# Patient Record
Sex: Female | Born: 1995
Health system: Southern US, Community
[De-identification: ages and names within clinical notes are randomized; demographics above are authoritative.]

---
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
## 2020-05-14 ENCOUNTER — Ambulatory Visit
Admission: EM | Admit: 2020-05-14 | Discharge: 2020-05-14 | Disposition: A | Discharge status: Home / Self Care | Payer: Managed Care, Other (non HMO) | Attending: Emergency Medicine | Admitting: Emergency Medicine

## 2020-05-14 ENCOUNTER — Ambulatory Visit: Payer: Self-pay

## 2020-05-14 ENCOUNTER — Other Ambulatory Visit: Payer: Self-pay

## 2020-05-14 DIAGNOSIS — Z1152 Encounter for screening for COVID-19: Secondary | ICD-10-CM | POA: Diagnosis not present

## 2020-05-14 DIAGNOSIS — J029 Acute pharyngitis, unspecified: Secondary | ICD-10-CM

## 2020-05-14 DIAGNOSIS — J069 Acute upper respiratory infection, unspecified: Secondary | ICD-10-CM

## 2020-05-14 MED ORDER — CETIRIZINE HCL 10 MG PO TABS: 10.0000 mg | ORAL_TABLET | Freq: Every day | ORAL | 0 refills | Status: AC

## 2020-05-14 MED ORDER — BENZONATATE 100 MG PO CAPS: 100.0000 mg | ORAL_CAPSULE | Freq: Three times a day (TID) | ORAL | 0 refills | Status: DC

## 2020-05-14 MED ORDER — DEXAMETHASONE 4 MG PO TABS: 4.0000 mg | ORAL_TABLET | Freq: Every day | ORAL | 0 refills | Status: AC

## 2020-05-14 MED ORDER — FLUTICASONE PROPIONATE 50 MCG/ACT NA SUSP: 1.0000 | Freq: Every day | NASAL | 0 refills | Status: AC

## 2020-05-14 NOTE — Discharge Instructions (Addendum)
COVID testing ordered.  It will take between 2-7 days for test results.  Someone will contact you regarding abnormal results.    In the meantime: You should remain isolated in your home for 10 days from symptom onset AND greater than 24 hours after symptoms resolution (absence of fever without the use of fever-reducing medication and improvement in respiratory symptoms), whichever is longer Get plenty of rest and push fluids Tessalon Perles prescribed for cough Zyrtec for nasal congestion, runny nose, and/or sore throat Flonase for nasal congestion and runny nose Discharge was prescribed Use OTC throat lozenges such as Halls, Cepacol or Vicks to soothe throat Use medications daily for symptom relief Use OTC medications like ibuprofen or tylenol as needed fever or pain Call or go to the ED if you have any new or worsening symptoms such as fever, worsening cough, shortness of breath, chest tightness, chest pain, turning blue, changes in mental status, etc..Marland Kitchen

## 2020-05-14 NOTE — ED Triage Notes (Signed)
Pt presents with headache and sore throat after covid exposure

## 2020-05-14 NOTE — ED Provider Notes (Addendum)
[facility]   [id number] 05/14/20 Arrival Time: 0849   CC: COVID symptoms  SUBJECTIVE: History from: patient and family.  Renee Brown is a 24 y.o. female who presented to the urgent care for complaint of headache, sore throat, cough and nasal congestion after Covid exposure few days ago..  Denies sick exposure to  flu or strep.  Denies recent travel.  Has tried OTC medication without relief.  Denies aggravating factors.  Denies previous symptoms in the past.   Denies fever, chills, fatigue, sinus pain, rhinorrhea,  SOB, wheezing, chest pain, nausea, changes in bowel or bladder habits.    ROS: As per HPI.  All other pertinent ROS negative.     History reviewed. No pertinent past medical history. History reviewed. No pertinent surgical history. No Known Allergies No current facility-administered medications on file prior to encounter.   No current outpatient medications on file prior to encounter.   Social History   Socioeconomic History  . Marital status: Single    Spouse name: Not on file  . Number of children: Not on file  . Years of education: Not on file  . Highest education level: Not on file  Occupational History  . Not on file  Tobacco Use  . Smoking status: Never Smoker  . Smokeless tobacco: Never Used  Vaping Use  . Vaping Use: Some days  Substance and Sexual Activity  . Alcohol use: Not Currently  . Drug use: Not Currently  . Sexual activity: Not on file  Other Topics Concern  . Not on file  Social History Narrative  . Not on file   Social Determinants of Health   Financial Resource Strain:   . Difficulty of Paying Living Expenses: Not on file  Food Insecurity:   . Worried About Programme researcher, broadcasting/film/video in the Last Year: Not on file  . Ran Out of Food in the Last Year: Not on file  Transportation Needs:   . Lack of Transportation (Medical): Not on file  . Lack of Transportation (Non-Medical): Not on file  Physical Activity:   . Days of  Exercise per Week: Not on file  . Minutes of Exercise per Session: Not on file  Stress:   . Feeling of Stress : Not on file  Social Connections:   . Frequency of Communication with Friends and Family: Not on file  . Frequency of Social Gatherings with Friends and Family: Not on file  . Attends Religious Services: Not on file  . Active Member of Clubs or Organizations: Not on file  . Attends Banker Meetings: Not on file  . Marital Status: Not on file  Intimate Partner Violence:   . Fear of Current or Ex-Partner: Not on file  . Emotionally Abused: Not on file  . Physically Abused: Not on file  . Sexually Abused: Not on file   History reviewed. No pertinent family history.  OBJECTIVE:  Vitals:   05/14/20 0911  BP: 113/76  Pulse: (!) 107  Resp: 20  Temp: 98.9 F (37.2 C)  SpO2: 98%     General appearance: alert; appears fatigued, but nontoxic; speaking in full sentences and tolerating own secretions HEENT: NCAT; Ears: EACs clear, TMs pearly gray; Eyes: PERRL.  EOM grossly intact. Sinuses: nontender; Nose: nares patent without rhinorrhea, Throat: oropharynx clear, tonsils non erythematous or enlarged, uvula midline  Neck: supple without LAD Lungs: unlabored respirations, symmetrical air entry; cough: moderate; no respiratory distress; CTAB Heart: regular rate and rhythm.  Radial  pulses 2+ symmetrical bilaterally Skin: warm and dry Psychological: alert and cooperative; normal mood and affect  LABS:  No results found for this or any previous visit (from the past 24 hour(s)).   ASSESSMENT & PLAN:  1. Encounter for screening for COVID-19   2. URI with cough and congestion   3. Sore throat     Meds ordered this encounter  Medications  . benzonatate (TESSALON) 100 MG capsule    Sig: Take 1 capsule (100 mg total) by mouth every 8 (eight) hours.    Dispense:  30 capsule    Refill:  0  . fluticasone (FLONASE) 50 MCG/ACT nasal spray    Sig: Place 1 spray into  both nostrils daily for 14 days.    Dispense:  16 g    Refill:  0  . cetirizine (ZYRTEC ALLERGY) 10 MG tablet    Sig: Take 1 tablet (10 mg total) by mouth daily.    Dispense:  30 tablet    Refill:  0  . dexamethasone (DECADRON) 4 MG tablet    Sig: Take 1 tablet (4 mg total) by mouth daily for 7 days.    Dispense:  7 tablet    Refill:  0    Discharge instructions  COVID testing ordered.  It will take between 2-7 days for test results.  Someone will contact you regarding abnormal results.    In the meantime: You should remain isolated in your home for 10 days from symptom onset AND greater than 24 hours after symptoms resolution (absence of fever without the use of fever-reducing medication and improvement in respiratory symptoms), whichever is longer Get plenty of rest and push fluids Tessalon Perles prescribed for cough Zyrtec for nasal congestion, runny nose, and/or sore throat Flonase for nasal congestion and runny nose Discharge was prescribed Use OTC throat lozenges such as Halls, Cepacol or Vicks to soothe throat Use medications daily for symptom relief Use OTC medications like ibuprofen or tylenol as needed fever or pain Call or go to the ED if you have any new or worsening symptoms such as fever, worsening cough, shortness of breath, chest tightness, chest pain, turning blue, changes in mental status, etc...   Reviewed expectations re: course of current medical issues. Questions answered. Outlined signs and symptoms indicating need for more acute intervention. Patient verbalized understanding. After Visit Summary given.         Durward Parcel, FNP 05/14/20 0941    Durward Parcel, FNP 05/14/20 [phone]

## 2020-05-16 LAB — COVID-19, FLU A+B AND RSV
Influenza A, NAA: NOT DETECTED
Influenza B, NAA: NOT DETECTED
RSV, NAA: NOT DETECTED
SARS-CoV-2, NAA: DETECTED — AB

## 2020-05-22 ENCOUNTER — Other Ambulatory Visit: Payer: Self-pay

## 2020-05-22 ENCOUNTER — Other Ambulatory Visit: Payer: Managed Care, Other (non HMO)

## 2020-05-22 DIAGNOSIS — Z20822 Contact with and (suspected) exposure to covid-19: Secondary | ICD-10-CM

## 2020-05-24 LAB — SARS-COV-2, NAA 2 DAY TAT

## 2020-05-24 LAB — SPECIMEN STATUS REPORT

## 2020-05-24 LAB — NOVEL CORONAVIRUS, NAA: SARS-CoV-2, NAA: DETECTED — AB

## 2020-05-27 ENCOUNTER — Other Ambulatory Visit: Payer: Self-pay

## 2020-05-27 DIAGNOSIS — Z20822 Contact with and (suspected) exposure to covid-19: Secondary | ICD-10-CM

## 2020-05-29 LAB — SARS-COV-2, NAA 2 DAY TAT

## 2020-05-29 LAB — SPECIMEN STATUS REPORT

## 2020-05-29 LAB — NOVEL CORONAVIRUS, NAA: SARS-CoV-2, NAA: DETECTED — AB

## 2020-06-01 ENCOUNTER — Telehealth (HOSPITAL_COMMUNITY): Payer: Self-pay | Admitting: Family

## 2020-06-01 NOTE — Telephone Encounter (Signed)
Called to discuss with Renee Brown about Covid symptoms and potential candidacy for the use of sotrovimab, a combination monoclonal antibody infusion for those with mild to moderate Covid symptoms and at a high risk of hospitalization.     Pt is qualified for this infusion at the infusion center due to co-morbid conditions and/or a member of an at-risk group, however unable to reach patient. VM left.   Carmela Piechowski,NP

## 2020-07-28 ENCOUNTER — Ambulatory Visit (INDEPENDENT_AMBULATORY_CARE_PROVIDER_SITE_OTHER): Payer: Managed Care, Other (non HMO)

## 2020-07-28 ENCOUNTER — Ambulatory Visit
Admission: EM | Admit: 2020-07-28 | Discharge: 2020-07-28 | Disposition: A | Discharge status: Home / Self Care | Payer: Managed Care, Other (non HMO) | Attending: Emergency Medicine | Admitting: Emergency Medicine

## 2020-07-28 ENCOUNTER — Other Ambulatory Visit: Payer: Self-pay

## 2020-07-28 DIAGNOSIS — R0602 Shortness of breath: Secondary | ICD-10-CM | POA: Diagnosis not present

## 2020-07-28 DIAGNOSIS — M940 Chondrocostal junction syndrome [Tietze]: Secondary | ICD-10-CM

## 2020-07-28 DIAGNOSIS — R079 Chest pain, unspecified: Secondary | ICD-10-CM | POA: Diagnosis not present

## 2020-07-28 LAB — POCT URINE PREGNANCY: Preg Test, Ur: NEGATIVE

## 2020-07-28 MED ORDER — ALBUTEROL SULFATE HFA 108 (90 BASE) MCG/ACT IN AERS: 1.0000 | INHALATION_SPRAY | Freq: Four times a day (QID) | RESPIRATORY_TRACT | 1 refills | Status: AC | PRN

## 2020-07-28 NOTE — ED Provider Notes (Signed)
[facility]   [id number] 07/28/20 Arrival Time: 0804   Chief Complaint  Patient presents with  . pain under right breast     SUBJECTIVE: History from: patient.  Renee Brown is a 25 y.o. female who presented to the urgent care for complaint of pain on the right breast and shortness of breath for the past 2 days.  Denies any precipitating event.  Localized pain to the right ribs under breast.  She describes the pain as constant and achy.  She has tried OTC medications without relief.  Her s symptoms are made worse with respiration.  She denies similar symptoms in the past.  Denies chills, fever, nausea, vomiting, diarrhea, chest pain.   ROS: As per HPI.  All other pertinent ROS negative.     No past medical history on file. No past surgical history on file. No Known Allergies No current facility-administered medications on file prior to encounter.   Current Outpatient Medications on File Prior to Encounter  Medication Sig Dispense Refill  . benzonatate (TESSALON) 100 MG capsule Take 1 capsule (100 mg total) by mouth every 8 (eight) hours. 30 capsule 0  . cetirizine (ZYRTEC ALLERGY) 10 MG tablet Take 1 tablet (10 mg total) by mouth daily. 30 tablet 0  . fluticasone (FLONASE) 50 MCG/ACT nasal spray Place 1 spray into both nostrils daily for 14 days. 16 g 0   Social History   Socioeconomic History  . Marital status: Married    Spouse name: Not on file  . Number of children: Not on file  . Years of education: Not on file  . Highest education level: Not on file  Occupational History  . Not on file  Tobacco Use  . Smoking status: Never Smoker  . Smokeless tobacco: Never Used  Vaping Use  . Vaping Use: Some days  Substance and Sexual Activity  . Alcohol use: Not Currently  . Drug use: Not Currently  . Sexual activity: Not on file  Other Topics Concern  . Not on file  Social History Narrative   ** Merged History Encounter **       ** Merged History  Encounter **       ** Merged History Encounter **       Social Determinants of Corporate investment banker Strain: Not on file  Food Insecurity: Not on file  Transportation Needs: Not on file  Physical Activity: Not on file  Stress: Not on file  Social Connections: Not on file  Intimate Partner Violence: Not on file   No family history on file.  OBJECTIVE:  Vitals:   07/28/20 0817  BP: (!) 145/85  Pulse: 66  Resp: 18  Temp: 98.5 F (36.9 C)  TempSrc: Oral  SpO2: 98%     Physical Exam Vitals and nursing note reviewed.  Constitutional:      General: She is not in acute distress.    Appearance: Normal appearance. She is normal weight. She is not ill-appearing, toxic-appearing or diaphoretic.  HENT:     Head: Normocephalic.  Cardiovascular:     Rate and Rhythm: Normal rate and regular rhythm.     Pulses: Normal pulses.     Heart sounds: Normal heart sounds. No murmur heard. No friction rub. No gallop.   Pulmonary:     Effort: Pulmonary effort is normal. No respiratory distress.     Breath sounds: Normal breath sounds. No stridor. No wheezing, rhonchi or rales.  Chest:     Chest  wall: No tenderness.  Musculoskeletal:        General: Tenderness present.     Comments: Tenderness on the right breast.  There is no ecchymosis, open wound, lesion or warmth present.  Neurological:     Mental Status: She is alert and oriented to person, place, and time.      LABS:  Results for orders placed or performed during the hospital encounter of 07/28/20 (from the past 24 hour(s))  POCT urine pregnancy     Status: None   Collection Time: 07/28/20  8:31 AM  Result Value Ref Range   Preg Test, Ur Negative Negative     RADIOLOGY      DG Chest 2 View  Result Date: 07/28/2020 CLINICAL DATA:  Pain under right breast. EXAM: CHEST - 2 VIEW COMPARISON:  None. FINDINGS: The heart size and mediastinal contours are within normal limits. Both lungs are clear. No visible pleural  effusions or pneumothorax. No acute osseous abnormality. IMPRESSION: No active cardiopulmonary disease. Electronically Signed   By: Feliberto Harts MD   On: 07/28/2020 08:45     Chest X-ray is negative for cardiopulmonary disease.  I have reviewed the x-ray myself and the radiologist interpretation.  I am in agreement with the radiologist interpretation.   ASSESSMENT & PLAN:  1. SOB (shortness of breath)   2. Costochondritis     Meds ordered this encounter  Medications  . albuterol (VENTOLIN HFA) 108 (90 Base) MCG/ACT inhaler    Sig: Inhale 1-2 puffs into the lungs every 6 (six) hours as needed for shortness of breath.    Dispense:  18 g    Refill:  1    Discharge instructions  Get plenty of rest and push fluids Prescribe albuterol Use medications daily for symptom relief Use OTC medications like ibuprofen or tylenol as needed fever or pain Call or go to the ED if you have any new or worsening symptoms such as fever, worsening cough, shortness of breath, chest tightness, chest pain, turning blue, changes in mental status, etc...   Reviewed expectations re: course of current medical issues. Questions answered. Outlined signs and symptoms indicating need for more acute intervention. Patient verbalized understanding. After Visit Summary given.         Durward Parcel, FNP 07/28/20 [phone]

## 2020-07-28 NOTE — Discharge Instructions (Addendum)
Get plenty of rest and push fluids Prescribe albuterol Use medications daily for symptom relief Use OTC medications like ibuprofen or tylenol as needed fever or pain Call or go to the ED if you have any new or worsening symptoms such as fever, worsening cough, shortness of breath, chest tightness, chest pain, turning blue, changes in mental status, etc..Marland Kitchen

## 2020-07-28 NOTE — ED Triage Notes (Signed)
SOB x 2 days.  States she has a pain under her right breast when she breaths hard.  States she has been vaping nicotine for 6 months.  History of covid back in December.

## 2022-01-01 ENCOUNTER — Ambulatory Visit: Payer: Managed Care, Other (non HMO)

## 2022-03-09 ENCOUNTER — Ambulatory Visit
Admission: EM | Admit: 2022-03-09 | Discharge: 2022-03-09 | Disposition: A | Discharge status: Home / Self Care | Payer: Managed Care, Other (non HMO)

## 2022-03-09 DIAGNOSIS — Z1152 Encounter for screening for COVID-19: Secondary | ICD-10-CM | POA: Diagnosis present

## 2022-03-09 DIAGNOSIS — J069 Acute upper respiratory infection, unspecified: Secondary | ICD-10-CM | POA: Insufficient documentation

## 2022-03-09 LAB — POCT RAPID STREP A (OFFICE): Rapid Strep A Screen: NEGATIVE

## 2022-03-09 MED ORDER — BENZONATATE 100 MG PO CAPS: 100.0000 mg | ORAL_CAPSULE | Freq: Three times a day (TID) | ORAL | 0 refills | Status: AC | PRN

## 2022-03-09 NOTE — Discharge Instructions (Addendum)
You have a viral upper respiratory infection.  This should improve over the next 7 to 10 days.  If your symptoms last more than 10 days without improvement, please follow-up with [place] or primary care provider.  With any chest pain or shortness of breath, go to the emergency room.    Rapid strep throat test is negative today.  We have tested for COVID-19 and we will call you with positive results.  You will see the results on MyChart as well.  Please stay home and isolate until you are aware of the results.  Some things that can make you feel better are: - Increased rest - Increasing fluid with water/sugar free electrolytes - Acetaminophen and ibuprofen as needed for fever/pain.  - Salt water gargling, chloraseptic spray and throat lozenges - OTC guaifenesin (Mucinex) 600 mg twice daily.  - Saline sinus flushes or a neti pot.  - Humidifying the air. -Cough Perles during the day for dry cough

## 2022-03-09 NOTE — ED Triage Notes (Signed)
Pt reports sore throat, neck pain and nasal congestion x 1 day. Neck pain improves with ibuprofen.

## 2022-03-09 NOTE — ED Provider Notes (Signed)
[facility]    CSN: [id number] Arrival date & time: 03/09/22  1551      History   Chief Complaint Chief Complaint  Patient presents with   Sore Throat   Nasal Congestion        Neck Pain    HPI Renee Brown is a 26 y.o. female.   Patient presents for 1 day of body aches, neck pain, cough worse first in the morning, nasal congestion, runny nose, sneezing, sore throat, and fatigue.  She denies fever, chills, shortness of breath, chest pain or tightness, chest congestion, postnasal drainage, sinus pressure, ear pain, abdominal pain, nausea/vomiting, diarrhea, decreased appetite, loss of taste or smell, and new rash.  No known sick contacts.  Has taken ibuprofen for symptoms which does help.     History reviewed. No pertinent past medical history.  There are no problems to display for this patient.   History reviewed. No pertinent surgical history.  OB History   No obstetric history on file.      Home Medications    Prior to Admission medications   Medication Sig Start Date End Date Taking? Authorizing Provider  ibuprofen (ADVIL) 200 MG tablet Take 200 mg by mouth every 6 (six) hours as needed.   Yes [provider]  albuterol (VENTOLIN HFA) 108 (90 Base) MCG/ACT inhaler Inhale 1-2 puffs into the lungs every 6 (six) hours as needed for shortness of breath. 07/28/20   Avegno, Zachery Dakins, FNP  benzonatate (TESSALON) 100 MG capsule Take 1 capsule (100 mg total) by mouth 3 (three) times daily as needed for cough. Do not take with alcohol or while driving or operating heavy machinery.  May cause drowsiness. 03/09/22   Valentino Nose, NP  cetirizine (ZYRTEC ALLERGY) 10 MG tablet Take 1 tablet (10 mg total) by mouth daily. 05/14/20   Avegno, Zachery Dakins, FNP  citalopram (CELEXA) 10 MG tablet Take 10 mg by mouth daily. 03/06/22   [provider]  fluticasone (FLONASE) 50 MCG/ACT nasal spray Place 1 spray into both nostrils daily for 14 days.  05/14/20 05/28/20  Durward Parcel, FNP    Family History History reviewed. No pertinent family history.  Social History Social History   Tobacco Use   Smoking status: Never   Smokeless tobacco: Never  Vaping Use   Vaping Use: Some days  Substance Use Topics   Alcohol use: Not Currently   Drug use: Not Currently     Allergies   Patient has no known allergies.   Review of Systems Review of Systems Per HPI  Physical Exam Triage Vital Signs ED Triage Vitals  Enc Vitals Group     BP 03/09/22 1601 130/82     Pulse Rate 03/09/22 1601 90     Resp 03/09/22 1601 16     Temp 03/09/22 1601 98.9 F (37.2 C)     Temp Source 03/09/22 1601 Oral     SpO2 03/09/22 1601 97 %     Weight --      Height --      Head Circumference --      Peak Flow --      Pain Score 03/09/22 1559 2     Pain Loc --      Pain Edu? --      Excl. in GC? --    No data found.  Updated Vital Signs BP 130/82 (BP Location: Right Arm)   Pulse 90   Temp 98.9 F (37.2 C) (Oral)  Resp 16   LMP  (Within Weeks)   SpO2 97%   Visual Acuity Right Eye Distance:   Left Eye Distance:   Bilateral Distance:    Right Eye Near:   Left Eye Near:    Bilateral Near:     Physical Exam Vitals and nursing note reviewed.  Constitutional:      General: She is not in acute distress.    Appearance: Normal appearance. She is not ill-appearing or toxic-appearing.  HENT:     Head: Normocephalic and atraumatic.     Right Ear: Tympanic membrane, ear canal and external ear normal. No drainage, swelling or tenderness. No middle ear effusion. Tympanic membrane is not erythematous.     Left Ear: Tympanic membrane, ear canal and external ear normal. No drainage, swelling or tenderness.  No middle ear effusion. Tympanic membrane is not erythematous.     Nose: Congestion and rhinorrhea present.     Mouth/Throat:     Mouth: Mucous membranes are moist.     Pharynx: Oropharynx is clear. Posterior oropharyngeal erythema  present. No oropharyngeal exudate.     Tonsils: No tonsillar exudate. 0 on the right. 0 on the left.  Eyes:     General: No scleral icterus.    Extraocular Movements: Extraocular movements intact.  Cardiovascular:     Rate and Rhythm: Normal rate and regular rhythm.  Pulmonary:     Effort: Pulmonary effort is normal. No respiratory distress.     Breath sounds: Normal breath sounds. No wheezing, rhonchi or rales.  Abdominal:     General: Abdomen is flat. Bowel sounds are normal.     Palpations: Abdomen is soft.     Tenderness: There is no abdominal tenderness.  Musculoskeletal:     Cervical back: Normal range of motion and neck supple.  Lymphadenopathy:     Cervical: Cervical adenopathy present.  Skin:    General: Skin is warm and dry.     Coloration: Skin is not jaundiced or pale.     Findings: No erythema or rash.  Neurological:     Mental Status: She is alert and oriented to person, place, and time.     Motor: No weakness.  Psychiatric:        Behavior: Behavior is cooperative.      UC Treatments / Results  Labs (all labs ordered are listed, but only abnormal results are displayed) Labs Reviewed  SARS CORONAVIRUS 2 (TAT 6-24 HRS)  POCT RAPID STREP A (OFFICE)    EKG   Radiology No results found.  Procedures Procedures (including critical care time)  Medications Ordered in UC Medications - No data to display  Initial Impression / Assessment and Plan / UC Course  I have reviewed the triage vital signs and the nursing notes.  Pertinent labs & imaging results that were available during my care of the patient were reviewed by me and considered in my medical decision making (see chart for details).    Patient is well-appearing, normotensive, afebrile, not tachycardic, not tachypneic, oxygenating well on room air.  Discussed with patient that symptoms are likely viral in etiology.  Rapid strep throat test negative.  COVID-19 testing obtained.  Supportive care  discussed.  Start [employer] as needed for dry cough.  Note given for work.  ER and return precautions discussed.  The patient was given the opportunity to ask questions.  All questions answered to their satisfaction.  The patient is in agreement to this plan.    Final Clinical  Impressions(s) / UC Diagnoses   Final diagnoses:  Encounter for screening for COVID-19  Viral URI with cough     Discharge Instructions      You have a viral upper respiratory infection.  This should improve over the next 7 to 10 days.  If your symptoms last more than 10 days without improvement, please follow-up with [place] or primary care provider.  With any chest pain or shortness of breath, go to the emergency room.    Rapid strep throat test is negative today.  We have tested for COVID-19 and we will call you with positive results.  You will see the results on MyChart as well.  Please stay home and isolate until you are aware of the results.  Some things that can make you feel better are: - Increased rest - Increasing fluid with water/sugar free electrolytes - Acetaminophen and ibuprofen as needed for fever/pain.  - Salt water gargling, chloraseptic spray and throat lozenges - OTC guaifenesin (Mucinex) 600 mg twice daily.  - Saline sinus flushes or a neti pot.  - Humidifying the air. -Cough Perles during the day for dry cough     ED Prescriptions     Medication Sig Dispense Auth. Provider   benzonatate (TESSALON) 100 MG capsule Take 1 capsule (100 mg total) by mouth 3 (three) times daily as needed for cough. Do not take with alcohol or while driving or operating heavy machinery.  May cause drowsiness. 30 capsule Valentino Nose, NP      PDMP not reviewed this encounter.   Valentino Nose, NP 03/09/22 1635

## 2022-03-10 LAB — SARS CORONAVIRUS 2 (TAT 6-24 HRS): SARS Coronavirus 2: NEGATIVE

## 2022-07-09 ENCOUNTER — Ambulatory Visit
Admission: EM | Admit: 2022-07-09 | Discharge: 2022-07-09 | Disposition: A | Discharge status: Home / Self Care | Payer: Managed Care, Other (non HMO) | Attending: Nurse Practitioner | Admitting: Nurse Practitioner

## 2022-07-09 DIAGNOSIS — S91312A Laceration without foreign body, left foot, initial encounter: Secondary | ICD-10-CM | POA: Diagnosis not present

## 2022-07-09 MED ORDER — AMOXICILLIN-POT CLAVULANATE 875-125 MG PO TABS: 1.0000 | ORAL_TABLET | Freq: Two times a day (BID) | ORAL | 0 refills | Status: AC

## 2022-07-09 NOTE — ED Notes (Addendum)
Triple antibiotic ointment, a non-adherent dressing, and coban wrap was applied to top left foot. Pt understands how to clean it and apply new clean dressing.

## 2022-07-09 NOTE — ED Provider Notes (Signed)
[facility]    CSN: [id number] Arrival date & time: 07/09/22  0902      History   Chief Complaint No chief complaint on file.   HPI Renee Brown is a 27 y.o. female.   The history is provided by the patient.   The patient presents with a laceration to the left foot.  States she was in the shower this morning when a glass fell onto the top of the left foot.  Patient admits to moderate bleeding.  Bleeding is controlled at this time.  Patient denies numbness or tingling to the left foot, but does have mild pain to the site.  She states that her tetanus shot is up-to-date.  History reviewed. No pertinent past medical history.  There are no problems to display for this patient.   History reviewed. No pertinent surgical history.  OB History   No obstetric history on file.      Home Medications    Prior to Admission medications   Medication Sig Start Date End Date Taking? Authorizing Provider  amoxicillin-clavulanate (AUGMENTIN) 875-125 MG tablet Take 1 tablet by mouth every 12 (twelve) hours. 07/09/22  Yes Caulder Wehner-Warren, Alda Lea, NP  albuterol (VENTOLIN HFA) 108 (90 Base) MCG/ACT inhaler Inhale 1-2 puffs into the lungs every 6 (six) hours as needed for shortness of breath. 07/28/20   Avegno, Darrelyn Hillock, FNP  benzonatate (TESSALON) 100 MG capsule Take 1 capsule (100 mg total) by mouth 3 (three) times daily as needed for cough. Do not take with alcohol or while driving or operating heavy machinery.  May cause drowsiness. 03/09/22   Eulogio Bear, NP  cetirizine (ZYRTEC ALLERGY) 10 MG tablet Take 1 tablet (10 mg total) by mouth daily. 05/14/20   Avegno, Darrelyn Hillock, FNP  citalopram (CELEXA) 10 MG tablet Take 10 mg by mouth daily. 03/06/22   [provider]  fluticasone (FLONASE) 50 MCG/ACT nasal spray Place 1 spray into both nostrils daily for 14 days. 05/14/20 05/28/20  Avegno, Darrelyn Hillock, FNP  ibuprofen (ADVIL) 200 MG tablet Take 200 mg by mouth every 6  (six) hours as needed.    [provider]    Family History History reviewed. No pertinent family history.  Social History Social History   Tobacco Use   Smoking status: Never   Smokeless tobacco: Never  Vaping Use   Vaping Use: Some days  Substance Use Topics   Alcohol use: Not Currently   Drug use: Not Currently     Allergies   Patient has no known allergies.   Review of Systems Review of Systems Per HPI  Physical Exam Triage Vital Signs ED Triage Vitals [07/09/22 0925]  Enc Vitals Group     BP 132/82     Pulse Rate 94     Resp      Temp 98.8 F (37.1 C)     Temp Source Oral     SpO2 99 %     Weight      Height      Head Circumference      Peak Flow      Pain Score 6     Pain Loc      Pain Edu?      Excl. in [place]?    No data found.  Updated Vital Signs BP 132/82 (BP Location: Right Arm)   Pulse 94   Temp 98.8 F (37.1 C) (Oral)   LMP 06/30/2022   SpO2 99%   Visual Acuity Right  Eye Distance:   Left Eye Distance:   Bilateral Distance:    Right Eye Near:   Left Eye Near:    Bilateral Near:     Physical Exam Vitals and nursing note reviewed.  Constitutional:      General: She is not in acute distress.    Appearance: Normal appearance.  HENT:     Head: Normocephalic.  Eyes:     Extraocular Movements: Extraocular movements intact.     Pupils: Pupils are equal, round, and reactive to light.  Cardiovascular:     Pulses:          Dorsalis pedis pulses are 2+ on the left side.       Posterior tibial pulses are 2+ on the left side.  Musculoskeletal:     Left foot: Normal range of motion.  Feet:     Left foot:     Skin integrity: No warmth.     Comments: +2 DP/PT pulses.  Neurovascular status is intact. Cap refill >2 Skin:    General: Skin is warm and dry.     Findings: Laceration present.     Comments: Laceration noted to the dorsal aspect of the left foot.  Laceration measures approximately 6 cm in length.  Bleeding is  controlled at this time.  Edges are clean.  No foreign object seen within the laceration.  Neurological:     General: No focal deficit present.     Mental Status: She is alert and oriented to person, place, and time.  Psychiatric:        Mood and Affect: Mood normal.        Behavior: Behavior normal.      UC Treatments / Results  Labs (all labs ordered are listed, but only abnormal results are displayed) Labs Reviewed - No data to display  EKG   Radiology No results found.  Procedures Laceration Repair  Date/Time: 07/09/2022 10:39 AM  Performed by: Tish Men, NP Authorized by: Tish Men, NP   Consent:    Consent obtained:  Verbal   Consent given by:  Patient and spouse   Risks discussed:  Infection, pain and poor wound healing   Alternatives discussed:  No treatment Universal protocol:    Procedure explained and questions answered to patient or proxy's satisfaction: yes     Patient identity confirmed:  Verbally with patient and arm band Anesthesia:    Anesthesia method:  Local infiltration   Local anesthetic:  Lidocaine 2% w/o epi (8 mL) Laceration details:    Location:  Foot   Foot location:  Top of L foot   Length (cm):  6 Pre-procedure details:    Preparation:  Patient was prepped and draped in usual sterile fashion Exploration:    Wound extent: no foreign bodies/material noted, no nerve damage noted, no tendon damage noted and no underlying fracture noted     Contaminated: no   Treatment:    Wound cleansed with: Hibiclens normal saline.   Amount of cleaning:  Standard   Irrigation method:  Tap and pressure wash Skin repair:    Repair method:  Sutures   Suture size:  3-0   Suture technique:  Simple interrupted   Number of sutures:  7 Approximation:    Approximation:  Loose Repair type:    Repair type:  Simple Post-procedure details:    Dressing:  Antibiotic ointment, non-adherent dressing and tube gauze (Coban)   Procedure  completion:  Tolerated well, no immediate complications  (including  critical care time)  Medications Ordered in UC Medications - No data to display  Initial Impression / Assessment and Plan / UC Course  I have reviewed the triage vital signs and the nursing notes.  Pertinent labs & imaging results that were available during my care of the patient were reviewed by me and considered in my medical decision making (see chart for details).  The patient is well-appearing, she is in no acute distress, vital signs are stable.  Patient with approximately 6 cm laceration to the top of the left foot.  7 sutures were placed with complete closure of the wound.  Will start patient on Augmentin for prophylaxis for infection due to the location of the wound and the current presentation.  Wound instructions were provided to the patient and her spouse along with indications of when follow-up would be necessary, signs and symptoms of infection, and use of over-the-counter analgesics for pain.  Patient to follow-up in this clinic on 07/19/2022 for suture removal.  Patient and spouse verbalized understanding.  All questions were answered.  Patient stable for discharge.  Work note was provided.  Final Clinical Impressions(s) / UC Diagnoses   Final diagnoses:  Laceration of left foot, initial encounter     Discharge Instructions      7 sutures were placed to the left foot.  Keep the sutures in place for the next 10 days.  You may return to this clinic for removal on 07/19/2022. Take medication as prescribed. Keep the dressing in place for 24 hours. Remove the dressing and clean the area with warm water in 24 hours.  When you are at home, may leave the area open to air.  When you are out, recommend keeping the area covered. May apply Neosporin to the lacerations as needed. May take over-the-counter Tylenol for pain or discomfort. Follow-up immediately if you develop swelling, increased redness that goes into  the hand or up the arm, drainage, or if you develop fever, chills, or other concerns. Follow-up as needed.      ED Prescriptions     Medication Sig Dispense Auth. Provider   amoxicillin-clavulanate (AUGMENTIN) 875-125 MG tablet Take 1 tablet by mouth every 12 (twelve) hours. 14 tablet Taleigha Pinson-Warren, Alda Lea, NP      PDMP not reviewed this encounter.   Tish Men, NP 07/09/22 1047

## 2022-07-09 NOTE — Discharge Instructions (Addendum)
7 sutures were placed to the left foot.  Keep the sutures in place for the next 10 days.  You may return to this clinic for removal on 07/19/2022. Take medication as prescribed. Keep the dressing in place for 24 hours. Remove the dressing and clean the area with warm water in 24 hours.  When you are at home, may leave the area open to air.  When you are out, recommend keeping the area covered. May apply Neosporin to the lacerations as needed. May take over-the-counter Tylenol for pain or discomfort. Follow-up immediately if you develop swelling, increased redness that goes into the hand or up the arm, drainage, or if you develop fever, chills, or other concerns. Follow-up as needed.

## 2022-07-09 NOTE — ED Triage Notes (Signed)
Pt reports glass fell on her left foot in the shower this morning.

## 2022-07-19 ENCOUNTER — Ambulatory Visit
Admission: RE | Admit: 2022-07-19 | Discharge: 2022-07-19 | Disposition: A | Discharge status: Home / Self Care | Payer: Managed Care, Other (non HMO) | Source: Ambulatory Visit | Attending: Nurse Practitioner | Admitting: Nurse Practitioner

## 2022-07-19 ENCOUNTER — Ambulatory Visit: Payer: Managed Care, Other (non HMO)

## 2022-07-19 VITALS — BP 136/78 | HR 71 | Temp 98.2°F | Resp 16

## 2022-07-19 DIAGNOSIS — M79672 Pain in left foot: Secondary | ICD-10-CM

## 2022-07-19 DIAGNOSIS — Z1881 Retained glass fragments: Secondary | ICD-10-CM

## 2022-07-19 DIAGNOSIS — L03116 Cellulitis of left lower limb: Secondary | ICD-10-CM

## 2022-07-19 MED ORDER — CEPHALEXIN 500 MG PO CAPS: 500.0000 mg | ORAL_CAPSULE | Freq: Four times a day (QID) | ORAL | 0 refills | Status: AC

## 2022-07-19 NOTE — Discharge Instructions (Addendum)
X-ray does not show any fracture or dislocation.  It does show a retained object.  This most likely was retained despite irrigation when your sutures were placed. This does not appear to be the cause of the swelling in your foot however. Take medication as prescribed. May take over-the-counter Tylenol Ibuprofen as needed to help with pain or discomfort. No weight bearing of the left foot until seen on 07/23/22. Keep the left foot elevated. Apply ice to the left foot to help with pain and swelling.  Apply for 20 minutes, remove for 1 hour, then repeat is much as possible. May take over-the-counter Tylenol or ibuprofen as needed for pain, fever, or general discomfort. Use the Ace wrap to help with swelling and compression.  Continue use of the crutches you are using with ambulation.  Recommend staying off the foot as much as possible for the next 48 hours. I would like for you to follow-up on 07/23/22. Will plan to remove the sutures at that time.  Follow-up sooner if you develop redness that goes up the leg or down the foot, worsening swelling, or if the area develops foul-smelling drainage or other concerns. Follow-up as needed.

## 2022-07-19 NOTE — ED Provider Notes (Signed)
[facility]    CSN: [id number] Arrival date & time: 07/19/22  [id number]      History   Chief Complaint Chief Complaint  Patient presents with   Foot Injury    Removing stitches - Entered by patient   Suture / Staple Removal    HPI Renee Brown is a 27 y.o. female.   The history is provided by the patient.   The patient presents for suture removal.  Sutures were placed on 07/09/22.  Patient states since that time, she is continue to have swelling and pain in the left foot.  She states that she was not able to finish the antibiotic because she developed GI symptoms such as nausea, vomiting, and diarrhea.  She states that she has noticed that the foot has become more swollen.  She states that she has been applying ice, and using crutches at this time.  Patient denies fever, chills, chest pain, abdominal pain, foul-smelling drainage, or redness.                                                                       History reviewed. No pertinent past medical history.  There are no problems to display for this patient.   History reviewed. No pertinent surgical history.  OB History   No obstetric history on file.      Home Medications    Prior to Admission medications   Medication Sig Start Date End Date Taking? Authorizing Provider  amoxicillin-clavulanate (AUGMENTIN) 875-125 MG tablet Take 1 tablet by mouth every 12 (twelve) hours. 07/09/22  Yes Angie Piercey-Warren, Alda Lea, NP  cephALEXin (KEFLEX) 500 MG capsule Take 1 capsule (500 mg total) by mouth 4 (four) times daily for 7 days. 07/19/22 07/26/22 Yes Shelena Castelluccio-Warren, Alda Lea, NP  cetirizine (ZYRTEC ALLERGY) 10 MG tablet Take 1 tablet (10 mg total) by mouth daily. 05/14/20  Yes Avegno, Darrelyn Hillock, FNP  citalopram (CELEXA) 10 MG tablet Take 10 mg by mouth daily. 03/06/22  Yes [provider]  ibuprofen (ADVIL) 200 MG tablet Take 200 mg by mouth every 6 (six) hours as needed.   Yes [provider]   albuterol (VENTOLIN HFA) 108 (90 Base) MCG/ACT inhaler Inhale 1-2 puffs into the lungs every 6 (six) hours as needed for shortness of breath. 07/28/20   Avegno, Darrelyn Hillock, FNP  benzonatate (TESSALON) 100 MG capsule Take 1 capsule (100 mg total) by mouth 3 (three) times daily as needed for cough. Do not take with alcohol or while driving or operating heavy machinery.  May cause drowsiness. 03/09/22   Eulogio Bear, NP  fluticasone (FLONASE) 50 MCG/ACT nasal spray Place 1 spray into both nostrils daily for 14 days. 05/14/20 05/28/20  Emerson Monte, FNP    Family History History reviewed. No pertinent family history.  Social History Social History   Tobacco Use   Smoking status: Never   Smokeless tobacco: Never  Vaping Use   Vaping Use: Some days  Substance Use Topics   Alcohol use: Not Currently   Drug use: Not Currently     Allergies   Patient has no known allergies.   Review of Systems Review of Systems Per HPI  Physical Exam Triage Vital Signs ED Triage Vitals  Enc Vitals Group     BP 07/19/22 1010 136/78     Pulse Rate 07/19/22 1010 71     Resp 07/19/22 1010 16     Temp 07/19/22 1010 98.2 F (36.8 C)     Temp Source 07/19/22 1010 Oral     SpO2 07/19/22 1010 98 %     Weight --      Height --      Head Circumference --      Peak Flow --      Pain Score 07/19/22 1011 2     Pain Loc --      Pain Edu? --      Excl. in [place]? --    No data found.  Updated Vital Signs BP 136/78 (BP Location: Right Arm)   Pulse 71   Temp 98.2 F (36.8 C) (Oral)   Resp 16   LMP 06/30/2022   SpO2 98%   Visual Acuity Right Eye Distance:   Left Eye Distance:   Bilateral Distance:    Right Eye Near:   Left Eye Near:    Bilateral Near:     Physical Exam Vitals and nursing note reviewed.  Constitutional:      General: She is not in acute distress.    Appearance: Normal appearance.  Musculoskeletal:     Left foot: Decreased range of motion. Normal capillary  refill. Swelling, laceration and tenderness present. Normal pulse.     Comments: Bruising noted to the dorsal aspect of the left foot.  Bruising noted in multiple stages.  Skin:    General: Skin is warm and dry.  Neurological:     General: No focal deficit present.     Mental Status: She is alert and oriented to person, place, and time.  Psychiatric:        Mood and Affect: Mood normal.        Behavior: Behavior normal.      UC Treatments / Results  Labs (all labs ordered are listed, but only abnormal results are displayed) Labs Reviewed - No data to display  EKG   Radiology DG Foot Complete Left  Result Date: 07/19/2022 CLINICAL DATA:  Left foot swelling and pain. Patient cut however foot with last 10 days ago. EXAM: LEFT FOOT - COMPLETE 3+ VIEW COMPARISON:  None Available. FINDINGS: There is no evidence of fracture or dislocation. There is no evidence of arthropathy or other focal bone abnormality. There is dorsal soft tissue swelling. There is a tiny linear radiopaque density in the soft tissues of the dorsal midfoot which likely represents retained foreign body given clinical history. IMPRESSION: 1. No acute fracture or dislocation. 2. Dorsal soft tissue swelling. 3. Tiny linear radiopaque density in the soft tissues of the dorsal midfoot likely represents retained foreign body given clinical history. Electronically Signed   By: Audie Pinto M.D.   On: 07/19/2022 10:40    Procedures Procedures (including critical care time)  Medications Ordered in UC Medications - No data to display  Initial Impression / Assessment and Plan / UC Course  I have reviewed the triage vital signs and the nursing notes.  Pertinent labs & imaging results that were available during my care of the patient were reviewed by me and considered in my medical decision making (see chart for details).  The patient is well-appearing, she is in no acute distress, vital signs are stable.  Patient  presents for suture removal.  Patient with continued swelling to the left foot.  Suture line is intact, sutures placed on 2/2 were also intact.  X-ray of the left foot did show a radiopaque object, most likely glass.  Symptoms are consistent with cellulitis.  Will start patient on Keflex 500 mg 4 times daily for the next 7 days, Ace wrap was also provided to provide compression and support, and use of over-the-counter analgesics were recommended for pain or discomfort.  Patient advised to stay off of the left foot is much as possible, and apply ice as advised.  Patient advised to follow-up in this clinic on 07/23/22 for reevaluation and for planned suture removal.  Strict follow-up precautions were provided to the patient.  Patient was in agreement with this plan of care.  Patient verbalizes understanding.  All questions were answered.  Patient stable for discharge.  Final Clinical Impressions(s) / UC Diagnoses   Final diagnoses:  Cellulitis of left foot  Retained glass fragment     Discharge Instructions      X-ray does not show any fracture or dislocation.  It does show a retained object.  This most likely was retained despite irrigation when your sutures were placed. This does not appear to be the cause of the swelling in your foot however. Take medication as prescribed. May take over-the-counter Tylenol Ibuprofen as needed to help with pain or discomfort. No weight bearing of the left foot until seen on 07/23/22. Keep the left foot elevated. Apply ice to the left foot to help with pain and swelling.  Apply for 20 minutes, remove for 1 hour, then repeat is much as possible. May take over-the-counter Tylenol or ibuprofen as needed for pain, fever, or general discomfort. Use the Ace wrap to help with swelling and compression.  Continue use of the crutches you are using with ambulation.  Recommend staying off the foot as much as possible for the next 48 hours. I would like for you to follow-up on  07/23/22. Will plan to remove the sutures at that time.  Follow-up sooner if you develop redness that goes up the leg or down the foot, worsening swelling, or if the area develops foul-smelling drainage or other concerns. Follow-up as needed.     ED Prescriptions     Medication Sig Dispense Auth. Provider   cephALEXin (KEFLEX) 500 MG capsule Take 1 capsule (500 mg total) by mouth 4 (four) times daily for 7 days. 28 capsule Jacarius Handel-Warren, Alda Lea, NP      PDMP not reviewed this encounter.   Tish Men, NP 07/19/22 1154

## 2022-07-19 NOTE — ED Triage Notes (Signed)
Patient came today to have stiches removed that were placed here on 07/09/22.

## 2022-07-23 ENCOUNTER — Ambulatory Visit
Admission: RE | Admit: 2022-07-23 | Discharge: 2022-07-23 | Disposition: A | Discharge status: Home / Self Care | Payer: Managed Care, Other (non HMO) | Source: Ambulatory Visit | Attending: Nurse Practitioner | Admitting: Nurse Practitioner

## 2022-07-23 VITALS — BP 121/72 | HR 70 | Temp 98.4°F | Resp 14

## 2022-07-23 DIAGNOSIS — S91302A Unspecified open wound, left foot, initial encounter: Secondary | ICD-10-CM

## 2022-07-23 DIAGNOSIS — S91312A Laceration without foreign body, left foot, initial encounter: Secondary | ICD-10-CM | POA: Diagnosis not present

## 2022-07-23 MED ORDER — MUPIROCIN 2 % EX OINT: 1.0000 | TOPICAL_OINTMENT | Freq: Two times a day (BID) | CUTANEOUS | 0 refills | Status: AC

## 2022-07-23 NOTE — ED Triage Notes (Signed)
Pt presents for follow up for swelling and suture recheck in left foot. Denies fever, chills, discharge.

## 2022-07-23 NOTE — ED Notes (Signed)
Applied ointment to site, applied gauze on top of dermaclips, and secured with coban. Pt tolerated well.   Site management and infection prevention education reviewed. Pt and pt family verbalized understanding.

## 2022-07-23 NOTE — ED Provider Notes (Addendum)
[facility]    CSN: [id number] Arrival date & time: 07/23/22  0917      History   Chief Complaint Chief Complaint  Patient presents with   Foot Injury    Check up on foot - Entered by patient   Appointment    0930    HPI Renee Brown is a 27 y.o. female.   The history is provided by the patient.   The patient presents for follow-up for a left foot injury that occurred on 07/09/2022.  Patient dropped a glass on the left foot while she was in the shower.  Patient presented to this clinic and 7 sutures were placed.  Patient followed up on 07/19/2022 with swelling to the left foot.  X-rays were completed which showed a small piece of glass that was retained in the foot, patient also complained of pain.  Today, patient with decreased swelling of the left foot, is still present.  She also has bruising noted to the top of the left foot.  She is taking Keflex 500 mg 4 times daily at this time.  She states she has been staying off of the foot, and keeping it iced and elevated.  She denies numbness, tingling, or radiation of pain.  Patient does complain of pain to the sides of her foot and on the top of her foot.  She denies fever, chills, chest pain, abdominal pain, nausea, vomiting, or diarrhea.  History reviewed. No pertinent past medical history.  There are no problems to display for this patient.   History reviewed. No pertinent surgical history.  OB History   No obstetric history on file.      Home Medications    Prior to Admission medications   Medication Sig Start Date End Date Taking? Authorizing Provider  mupirocin ointment (BACTROBAN) 2 % Apply 1 Application topically 2 (two) times daily. 07/23/22  Yes Bridgitte Felicetti-Warren, Alda Lea, NP  albuterol (VENTOLIN HFA) 108 (90 Base) MCG/ACT inhaler Inhale 1-2 puffs into the lungs every 6 (six) hours as needed for shortness of breath. 07/28/20   Avegno, Darrelyn Hillock, FNP  amoxicillin-clavulanate (AUGMENTIN) 875-125 MG tablet  Take 1 tablet by mouth every 12 (twelve) hours. 07/09/22   Viggo Perko-Warren, Alda Lea, NP  benzonatate (TESSALON) 100 MG capsule Take 1 capsule (100 mg total) by mouth 3 (three) times daily as needed for cough. Do not take with alcohol or while driving or operating heavy machinery.  May cause drowsiness. 03/09/22   Eulogio Bear, NP  cephALEXin (KEFLEX) 500 MG capsule Take 1 capsule (500 mg total) by mouth 4 (four) times daily for 7 days. 07/19/22 07/26/22  Hau Sanor-Warren, Alda Lea, NP  cetirizine (ZYRTEC ALLERGY) 10 MG tablet Take 1 tablet (10 mg total) by mouth daily. 05/14/20   Avegno, Darrelyn Hillock, FNP  citalopram (CELEXA) 10 MG tablet Take 10 mg by mouth daily. 03/06/22   [provider]  fluticasone (FLONASE) 50 MCG/ACT nasal spray Place 1 spray into both nostrils daily for 14 days. 05/14/20 05/28/20  Avegno, Darrelyn Hillock, FNP  ibuprofen (ADVIL) 200 MG tablet Take 200 mg by mouth every 6 (six) hours as needed.    [provider]    Family History History reviewed. No pertinent family history.  Social History Social History   Tobacco Use   Smoking status: Never   Smokeless tobacco: Never  Vaping Use   Vaping Use: Some days  Substance Use Topics   Alcohol use: Not Currently   Drug use: Not Currently  Allergies   Patient has no known allergies.   Review of Systems Review of Systems Per HPI  Physical Exam Triage Vital Signs ED Triage Vitals  Enc Vitals Group     BP 07/23/22 0955 121/72     Pulse Rate 07/23/22 0955 70     Resp 07/23/22 0955 14     Temp 07/23/22 0955 98.4 F (36.9 C)     Temp Source 07/23/22 0955 Oral     SpO2 07/23/22 0955 97 %     Weight --      Height --      Head Circumference --      Peak Flow --      Pain Score 07/23/22 0954 2     Pain Loc --      Pain Edu? --      Excl. in [place]? --    No data found.  Updated Vital Signs BP 121/72 (BP Location: Right Arm)   Pulse 70   Temp 98.4 F (36.9 C) (Oral)   Resp 14   LMP  (Within  Weeks) Comment: 3 weeks  SpO2 97%   Visual Acuity Right Eye Distance:   Left Eye Distance:   Bilateral Distance:    Right Eye Near:   Left Eye Near:    Bilateral Near:     Physical Exam Vitals and nursing note reviewed.  Constitutional:      General: She is not in acute distress.    Appearance: Normal appearance.  Skin:    General: Skin is warm and dry.     Findings: Laceration present.     Comments: Laceration to the left foot.  7 sutures are in place, suture line is intact.  No oozing, fluctuance, or drainage present.  Neurological:     General: No focal deficit present.     Mental Status: She is alert and oriented to person, place, and time.  Psychiatric:        Mood and Affect: Mood normal.        Behavior: Behavior normal.      7 sutures were removed from the laceration to the left foot.  Prior to suture removal, suture line was intact.  Post suture removal, wound dehisced.  3 derma clips were applied with use of benzoin.  Will refer patient to wound care for further evaluation of the wound and treatment.  UC Treatments / Results  Labs (all labs ordered are listed, but only abnormal results are displayed) Labs Reviewed - No data to display  EKG   Radiology No results found.  Procedures Procedures (including critical care time)  Medications Ordered in UC Medications - No data to display  Initial Impression / Assessment and Plan / UC Course  I have reviewed the triage vital signs and the nursing notes.  Pertinent labs & imaging results that were available during my care of the patient were reviewed by me and considered in my medical decision making (see chart for details).  Patient is well-appearing, she is in no acute distress, vital signs are stable.  Wound to the left foot with dehiscence after suture removal.  There is no oozing, fluctuance, or drainage at the wound to the left foot.  There is concern due to the wound opening after suture removal.   Discussing with Dr. Jamse Arn, discussion with Dr. Lanny Cramp, we are in agreement that patient should refer to wound care for further evaluation and for further treatment to ensure appropriate healing of the wound to  her left foot.  Patient will continue Keflex 500 mg at this time until prescription is completed.  Patient was prescribed mupirocin ointment 2% to apply to the left foot twice daily.  Supportive care recommendations were provided to the patient to include continuing elevation of the left foot, and weightbearing on the left foot at this time, and continuing to use ice.  Patient was given strict ER follow-up precautions, she is scheduled to follow-up in this clinic in 48 hours for a wound recheck.  Patient and husband are in agreement with this plan of care.  All questions were answered.  Understanding is verbalized.  Patient stable for discharge.  Received call from Marletta Lor with the wound care center.  She will plan to call the patient and schedule appointment for a wound consult on 07/26/2022 at the [place] [facility] location. Final Clinical Impressions(s) / UC Diagnoses   Final diagnoses:  Laceration of left foot, initial encounter  Unspecified open wound, left foot, initial encounter     Discharge Instructions      Apply medication as prescribed. Continue to stay off of the left foot at this time. Continue icing and elevating the foot.  Apply ice for 20 minutes, remove for 1 hour, then repeat as much as possible.  Keep the left foot elevated above the level of the heart as much as possible to help decrease swelling. Continue use of crutches at this time. You have been referred to [facility] in [place] at [place]. You will be contacted today regarding the appointment date and time. If you develop worsening redness, swelling, foul-smelling drainage, or other concerns, please follow-up in the emergency department. As discussed, please follow-up in this clinic on  07/25/2022 for wound check. Follow-up as needed.      ED Prescriptions     Medication Sig Dispense Auth. Provider   mupirocin ointment (BACTROBAN) 2 % Apply 1 Application topically 2 (two) times daily. 45 g Grethel Zenk-Warren, Alda Lea, NP      PDMP not reviewed this encounter.   Tish Men, NP 07/23/22 1155    Eryn Marandola-Warren, Alda Lea, NP 07/23/22 1505

## 2022-07-23 NOTE — Discharge Instructions (Addendum)
Apply medication as prescribed. Continue to stay off of the left foot at this time. Continue icing and elevating the foot.  Apply ice for 20 minutes, remove for 1 hour, then repeat as much as possible.  Keep the left foot elevated above the level of the heart as much as possible to help decrease swelling. Continue use of crutches at this time. You have been referred to [facility] in [place] at [place]. You will be contacted today regarding the appointment date and time. If you develop worsening redness, swelling, foul-smelling drainage, or other concerns, please follow-up in the emergency department. As discussed, please follow-up in this clinic on 07/25/2022 for wound check. Follow-up as needed.

## 2022-07-25 ENCOUNTER — Ambulatory Visit
Admission: RE | Admit: 2022-07-25 | Discharge: 2022-07-25 | Disposition: A | Discharge status: Home / Self Care | Payer: Managed Care, Other (non HMO) | Source: Ambulatory Visit | Attending: Nurse Practitioner | Admitting: Nurse Practitioner

## 2022-07-25 VITALS — BP 116/70 | HR 79 | Temp 98.1°F | Resp 18

## 2022-07-25 DIAGNOSIS — S91302A Unspecified open wound, left foot, initial encounter: Secondary | ICD-10-CM | POA: Diagnosis not present

## 2022-07-25 DIAGNOSIS — S91312A Laceration without foreign body, left foot, initial encounter: Secondary | ICD-10-CM

## 2022-07-25 MED ORDER — TRAMADOL HCL 50 MG PO TABS: 50.0000 mg | ORAL_TABLET | Freq: Four times a day (QID) | ORAL | 0 refills | Status: AC | PRN

## 2022-07-25 NOTE — ED Provider Notes (Addendum)
[facility]    CSN: [id number] Arrival date & time: 07/25/22  1046      History   Chief Complaint Chief Complaint  Patient presents with   Foot Injury    Follow up appointment - Entered by patient    HPI Renee Brown is a 27 y.o. female.   The history is provided by the patient and the spouse.   The patient presents for follow-up after a laceration to the left foot on 07/09/2022.  Patient presented that day and had 7 sutures placed to the dorsal aspect of the left foot just below the left ankle.  Patient was prescribed Augmentin, but was unable to tolerate it.  Patient returned on 07/19/2022 for suture removal.  On the day, patient had moderate swelling, pain, bruising, and tenderness to the left foot.  An x-ray was performed that same day which revealed a small shard of glass to the left foot.  Patient was provided prescription for Keflex 500 mg 4 times daily at that time and advised to stay off of the left foot, with and instructions to ice and elevate the foot is much as possible.  Patient return to this clinic on 07/23/2022 with decreased swelling.  7 sutures were removed at that time, after the last suture was removed, the wound dehisced.  Derma clips were applied at that time along with a dressing to the left foot.  Patient was further advised to stay off of the foot, nonweightbearing, and to continue RICE therapy.  Today, patient presents for wound check.  Derma clips are in place at this time.  She does have bloody drainage noted to the derma clip.  She also states that she has increased pain in the left foot today.  Patient denies new fever, chills, chest pain, abdominal pain, nausea, vomiting, diarrhea, warmth to the foot, foul-smelling drainage from the site, or decreased sensation to the foot.  History reviewed. No pertinent past medical history.  There are no problems to display for this patient.   History reviewed. No pertinent surgical history.  OB History    No obstetric history on file.      Home Medications    Prior to Admission medications   Medication Sig Start Date End Date Taking? Authorizing Provider  traMADol (ULTRAM) 50 MG tablet Take 1 tablet (50 mg total) by mouth every 6 (six) hours as needed. 07/25/22  Yes Sal Spratley-Warren, Alda Lea, NP  albuterol (VENTOLIN HFA) 108 (90 Base) MCG/ACT inhaler Inhale 1-2 puffs into the lungs every 6 (six) hours as needed for shortness of breath. 07/28/20   Avegno, Darrelyn Hillock, FNP  amoxicillin-clavulanate (AUGMENTIN) 875-125 MG tablet Take 1 tablet by mouth every 12 (twelve) hours. 07/09/22   Jasier Calabretta-Warren, Alda Lea, NP  benzonatate (TESSALON) 100 MG capsule Take 1 capsule (100 mg total) by mouth 3 (three) times daily as needed for cough. Do not take with alcohol or while driving or operating heavy machinery.  May cause drowsiness. 03/09/22   Eulogio Bear, NP  cephALEXin (KEFLEX) 500 MG capsule Take 1 capsule (500 mg total) by mouth 4 (four) times daily for 7 days. 07/19/22 07/26/22  Stesha Neyens-Warren, Alda Lea, NP  cetirizine (ZYRTEC ALLERGY) 10 MG tablet Take 1 tablet (10 mg total) by mouth daily. 05/14/20   Avegno, Darrelyn Hillock, FNP  citalopram (CELEXA) 10 MG tablet Take 10 mg by mouth daily. 03/06/22   [provider]  fluticasone (FLONASE) 50 MCG/ACT nasal spray Place 1 spray into both nostrils daily  for 14 days. 05/14/20 05/28/20  Avegno, Darrelyn Hillock, FNP  ibuprofen (ADVIL) 200 MG tablet Take 200 mg by mouth every 6 (six) hours as needed.    [provider]  mupirocin ointment (BACTROBAN) 2 % Apply 1 Application topically 2 (two) times daily. 07/23/22   Nyisha Clippard-Warren, Alda Lea, NP    Family History History reviewed. No pertinent family history.  Social History Social History   Tobacco Use   Smoking status: Never   Smokeless tobacco: Never  Vaping Use   Vaping Use: Some days  Substance Use Topics   Alcohol use: Not Currently   Drug use: Not Currently     Allergies   Patient  has no known allergies.   Review of Systems Review of Systems Per HPI  Physical Exam Triage Vital Signs ED Triage Vitals  Enc Vitals Group     BP 07/25/22 1126 116/70     Pulse Rate 07/25/22 1126 79     Resp 07/25/22 1126 18     Temp 07/25/22 1126 98.1 F (36.7 C)     Temp Source 07/25/22 1126 Oral     SpO2 07/25/22 1126 98 %     Weight --      Height --      Head Circumference --      Peak Flow --      Pain Score 07/25/22 1128 4     Pain Loc --      Pain Edu? --      Excl. in [place]? --    No data found.  Updated Vital Signs BP 116/70 (BP Location: Right Arm)   Pulse 79   Temp 98.1 F (36.7 C) (Oral)   Resp 18   LMP 07/24/2022 (Exact Date)   SpO2 98%   Visual Acuity Right Eye Distance:   Left Eye Distance:   Bilateral Distance:    Right Eye Near:   Left Eye Near:    Bilateral Near:     Physical Exam Vitals and nursing note reviewed.  Constitutional:      General: She is not in acute distress.    Appearance: Normal appearance.  HENT:     Head: Normocephalic.  Eyes:     Extraocular Movements: Extraocular movements intact.     Pupils: Pupils are equal, round, and reactive to light.  Pulmonary:     Effort: Pulmonary effort is normal.  Musculoskeletal:     Cervical back: Normal range of motion.  Skin:    General: Skin is warm and dry.     Capillary Refill: Capillary refill takes less than 2 seconds.     Findings: Bruising, laceration and wound present.     Comments: +2 Dp/PT pulses. Sensation is intact.   3 derma clips were removed from the left foot laceration.  The site was cleaned with Hibiclens soap and normal saline.  Sensation remains intact.  3- half inch Steri-Strips were placed to the left foot wound with antibiotic ointment, nonadherent gauze, tissue wrap, and gauze and Coban.  Patient advised to leave dressing in place until she was seen by wound care on 07/26/2022.Marland Kitchen  Neurological:     General: No focal deficit present.     Mental Status: She  is alert and oriented to person, place, and time.  Psychiatric:        Mood and Affect: Mood normal.        Behavior: Behavior normal.         UC Treatments / Results  Labs (  all labs ordered are listed, but only abnormal results are displayed) Labs Reviewed - No data to display  EKG   Radiology No results found.  Procedures Procedures (including critical care time)  Medications Ordered in UC Medications - No data to display  Initial Impression / Assessment and Plan / UC Course  I have reviewed the triage vital signs and the nursing notes.  Pertinent labs & imaging results that were available during my care of the patient were reviewed by me and considered in my medical decision making (see chart for details).  The patient is in no acute distress, her vital signs are stable at this time.  3 derma clips were removed from the wound to the left foot.  Site was cleaned with normal saline and Hibiclens soap.  Patient was able to tolerate well.  (3) -1/2 inch Steri-Strips were placed to the foot along with a nonadherent dressing, gauze, and Coban.  Patient is scheduled to follow-up with wound care on 07/26/2022.  For patient's continued pain, will prescribe tramadol 50 mg to take at bedtime for severe pain or discomfort.  Patient was advised to continue RICE therapy.  Reinforce strict indications of when follow-up in the emergency department may be indicated.  Patient verbalizes understanding and is in agreement with this plan of care.  Patient thanked this provider for her help with this injury.  Patient and spouse verbalized understanding, all questions were answered.  Patient stable for discharge.   Final Clinical Impressions(s) / UC Diagnoses   Final diagnoses:  Laceration of dorsum of left foot  Open wound of left foot, initial encounter     Discharge Instructions      Continue rest, ice, compression, and elevation of the left foot until you are seen by wound care  tomorrow on 07/26/2022. Take medication as prescribed.  As discussed, it is recommended that you take the medication with food and water. Continue to stay off of the left foot, with use of crutches. If symptoms suddenly worsen before your appointment with wound care tomorrow to include fever, chills, foul-smelling drainage, worsening swelling, pain, or other concerns, please follow-up in the emergency department immediately. Follow-up as needed.     ED Prescriptions     Medication Sig Dispense Auth. Provider   traMADol (ULTRAM) 50 MG tablet Take 1 tablet (50 mg total) by mouth every 6 (six) hours as needed. 12 tablet Freddrick Gladson-Warren, Alda Lea, NP      I have reviewed the PDMP during this encounter.   Tish Men, NP 07/25/22 1212    Tish Men, NP 07/25/22 1216

## 2022-07-25 NOTE — ED Triage Notes (Signed)
Here for recheck of left foot.  Foot continues to swelling.  Derma clips are still in place.

## 2022-07-25 NOTE — Discharge Instructions (Addendum)
Continue rest, ice, compression, and elevation of the left foot until you are seen by wound care tomorrow on 07/26/2022. Take medication as prescribed.  As discussed, it is recommended that you take the medication with food and water. Continue to stay off of the left foot, with use of crutches. If symptoms suddenly worsen before your appointment with wound care tomorrow to include fever, chills, foul-smelling drainage, worsening swelling, pain, or other concerns, please follow-up in the emergency department immediately. Follow-up as needed.

## 2022-07-26 ENCOUNTER — Encounter (HOSPITAL_BASED_OUTPATIENT_CLINIC_OR_DEPARTMENT_OTHER): Payer: Managed Care, Other (non HMO) | Attending: Internal Medicine | Admitting: Internal Medicine

## 2022-07-26 DIAGNOSIS — T798XXA Other early complications of trauma, initial encounter: Secondary | ICD-10-CM

## 2022-07-26 DIAGNOSIS — S91302A Unspecified open wound, left foot, initial encounter: Secondary | ICD-10-CM

## 2022-07-26 DIAGNOSIS — S91322A Laceration with foreign body, left foot, initial encounter: Secondary | ICD-10-CM | POA: Insufficient documentation

## 2022-07-29 NOTE — Progress Notes (Addendum)
ARHA, KOWALCZYK Brown ([id number]) [id number] Page 1 of 7 Visit Report for 07/26/2022 Chief Complaint Document Details Patient Name: Date of Service: Renee Brown, Renee Brown [facility] [name]. 07/26/2022 7:45 [name] Medical Record Number: [id number] Patient Account Number: [id number] Date of Birth/Sex: Treating RN: 1995/07/26 (27 y.o. F) Primary Care Provider: [name] NT Other Clinician: Referring Provider: Treating Provider/Extender: [name], [name] NT Weeks in Treatment: 0 Information Obtained from: Patient Chief Complaint 07/26/2022; left dorsal foot wound Electronic Signature(s) Signed: 07/26/2022 10:35:38 AM By: Kalman Shan DO Entered By: Kalman Shan on 07/26/2022 09:22:52 -------------------------------------------------------------------------------- HPI Details Patient Name: Date of Service: Renee Brown RBA [name]. 07/26/2022 7:45 [name] Medical Record Number: [id number] Patient Account Number: [id number] Date of Birth/Sex: Treating RN: June 12, 1995 (27 y.o. F) Primary Care Provider: [name] NT Other Clinician: Referring Provider: Treating Provider/Extender: [name], [name] NT Weeks in Treatment: 0 History of Present Illness HPI Description: 07/26/2022 [name] is [name] overall healthy individual that presents to the clinic for Brown trauma wound to her left dorsal foot. She states that she dropped glassware on her foot creating an open wound. She visited urgent care for this issue. She had sutures placed. She was initially started on Augmentin however could not tolerate this and was switched to Keflex. On Brown subsequent wound care visit to urgent care she had an x-ray that showed Brown retained object. She eventually had sutures removed and had 3 derma clips placed. These were removed yesterday in urgent care. She has been using mupirocin ointment to the wound bed. She currently denies signs of infection. She is using crutches  to help ambulate and work remove pressure from the foot. Electronic Signature(s) Signed: 07/26/2022 10:35:38 AM By: Kalman Shan DO Entered By: Kalman Shan on 07/26/2022 09:27:30 -------------------------------------------------------------------------------- Physical Exam Details Patient Name: Date of Service: Renee Brown RBA [name]. 07/26/2022 7:45 [name] Medical Record Number: [id number] Patient Account Number: [id number] Date of Birth/Sex: Treating RN: July 08, 1995 (27 y.o. F) Primary Care Provider: [name] NT Other Clinician: Referring Provider: Treating Provider/Extender: [name], [name] NT Weeks in Treatment: 0 Constitutional respirations regular, non-labored and within target range for patient.. Cardiovascular 2+ dorsalis pedis/posterior tibialis pulses. Psychiatric pleasant and cooperative. Notes Renee Brown Brown ([id number]) [id number] Page 2 of 7 Left foot: T the dorsal aspect there is Brown laceration with undermining to the inferior portion. Granulation tissue with dried old blood. Small piece of glass o removed. No signs of surrounding infection. Mild tenderness to palpation. Electronic Signature(s) Signed: 07/26/2022 10:35:38 AM By: Kalman Shan DO Entered By: Kalman Shan on 07/26/2022 09:28:36 -------------------------------------------------------------------------------- Physician Orders Details Patient Name: Date of Service: Renee Brown RBA [name]. 07/26/2022 7:45 [name] Medical Record Number: [id number] Patient Account Number: [id number] Date of Birth/Sex: Treating RN: [birth year] (27 y.o. F) Brown, Renee Primary Care Provider: [name] NT Other Clinician: Referring Provider: Treating Provider/Extender: [name], [name] NT Weeks in Treatment: 0 Verbal / Phone Orders: No Diagnosis Coding Follow-up Appointments ppointment in 1 week. - DR. Heber [employer] Return Brown Bathing/ Shower/ Hygiene May shower and  wash wound with soap and water. Edema Control - Lymphedema / SCD / Other Left Lower Extremity Segmental Compressive Device. Use the Segmental Compressive Device on leg(s) 2-3 times Brown day for 45 - 60 minutes. If wearing any wraps or hose, do not remove them. Continue exercising as instructed. Elevate legs to the level of the heart or above for 30 minutes daily and/or when sitting for 3-4 times  Brown day throughout the day. Avoid standing for long periods of time. Wound Treatment Wound #1 - Foot Wound Laterality: Dorsal, Left Cleanser: Soap and Water 1 x Per Day/30 Days Discharge Instructions: May shower and wash wound with dial antibacterial soap and water prior to dressing change. Cleanser: Wound Cleanser 1 x Per Day/30 Days Discharge Instructions: Cleanse the wound with wound cleanser prior to applying Brown clean dressing using gauze sponges, not tissue or cotton balls. Prim Dressing: Vashe 1 x Per Day/30 Days ary Discharge Instructions: Wet to dry dressing. Wet small piece of kerlix with Vashe solution and place over wound. Secondary Dressing: ABD Pad, 8x10 (DME) (Generic) 1 x Per Day/30 Days Discharge Instructions: Apply over primary dressing as directed. Secured With: Elastic Bandage 4 inch (ACE bandage) (DME) (Generic) 1 x Per Day/30 Days Discharge Instructions: Secure with ACE bandage as directed. Secured With: [employer], 4.5x3.1 (in/yd) (DME) (Generic) 1 x Per Day/30 Days Discharge Instructions: Secure with Kerlix as directed. Secured With: Transpore Surgical Tape, 2x10 (in/yd) (Generic) 1 x Per Day/30 Days Discharge Instructions: Secure dressing with tape as directed. Electronic Signature(s) Signed: 07/26/2022 10:35:38 AM By: Kalman Shan DO Signed: 07/28/2022 4:23:23 PM By: Blanche East RN Entered By: Blanche East on 07/26/2022 10:00:17 -------------------------------------------------------------------------------- Problem List Details Patient Name: Date of  Service: Renee Brown RBA [name]. 07/26/2022 7:45 [name] Medical Record Number: [id number] Patient Account Number: [id number] Date of Birth/Sex: Treating RN: [birth year] (27 y.o. F) Primary Care Provider: [name] NT Other Clinician: Referring Provider: Treating Provider/Extender: [name], [name] NT Weeks in TreatmentLUREE, Renee Brown ([id number]) [id number] Page 3 of 7 Active Problems ICD-10 Encounter Code Description Active Date MDM Diagnosis S91.302A Unspecified open wound, left foot, initial encounter 07/26/2022 No Yes T79.8XXA Other early complications of trauma, initial encounter 07/26/2022 No Yes S91.322A Laceration with foreign body, left foot, initial encounter 07/26/2022 No Yes Inactive Problems Resolved Problems Electronic Signature(s) Signed: 07/26/2022 10:35:38 AM By: Kalman Shan DO Entered By: Kalman Shan on 07/26/2022 09:21:42 -------------------------------------------------------------------------------- Progress Note Details Patient Name: Date of Service: Renee Brown RBA [name]. 07/26/2022 7:45 [name] Medical Record Number: [id number] Patient Account Number: [id number] Date of Birth/Sex: Treating RN: April 01, 1996 (27 y.o. F) Primary Care Provider: [name] NT Other Clinician: Referring Provider: Treating Provider/Extender: [name], [name] NT Weeks in Treatment: 0 Subjective Chief Complaint Information obtained from Patient 07/26/2022; left dorsal foot wound History of Present Illness (HPI) 07/26/2022 [name] is [name] overall healthy individual that presents to the clinic for Brown trauma wound to her left dorsal foot. She states that she dropped glassware on her foot creating an open wound. She visited urgent care for this issue. She had sutures placed. She was initially started on Augmentin however could not tolerate this and was switched to Keflex. On Brown subsequent wound care visit  to urgent care she had an x-ray that showed Brown retained object. She eventually had sutures removed and had 3 derma clips placed. These were removed yesterday in urgent care. She has been using mupirocin ointment to the wound bed. She currently denies signs of infection. She is using crutches to help ambulate and work remove pressure from the foot. Patient History Allergies No Known Allergies Family History Cancer - Maternal Grandparents, Diabetes - Maternal Grandparents, Heart Disease - Maternal Grandparents, Hypertension - Mother, Stroke - Paternal Grandparents, No family history of Hereditary Spherocytosis, Kidney Disease, Lung Disease, Seizures, Thyroid Problems, Tuberculosis. Social History Never smoker, Marital Status -  Married, Alcohol Use - Rarely, Drug Use - No History, Caffeine Use - Daily. Medical History Eyes Denies history of Cataracts, Glaucoma, Optic Neuritis Ear/Nose/Mouth/Throat Denies history of Chronic sinus problems/congestion, Middle ear problems Respiratory Denies history of Aspiration, Asthma, Chronic Obstructive Pulmonary Disease (COPD), Pneumothorax, Sleep Apnea, Tuberculosis Cardiovascular Denies history of Angina, Arrhythmia, Congestive Heart Failure, Coronary Artery Disease, Deep Vein Thrombosis, Hypertension, Hypotension, Myocardial Infarction, Peripheral Arterial Disease, Peripheral Venous Disease, Phlebitis, Vasculitis Gastrointestinal Denies history of Cirrhosis , Colitis, Crohnoos, Hepatitis Brown, Hepatitis B, Hepatitis C Endocrine Renee Brown, Renee Brown ([id number]) [id number] Page 4 of 7 Denies history of Type I Diabetes Genitourinary Denies history of End Stage Renal Disease Immunological Denies history of Lupus Erythematosus, Raynaudoos, Scleroderma Psychiatric Denies history of Anorexia/bulimia, Confinement Anxiety Medical Brown Surgical History Notes nd Ear/Nose/Mouth/Throat seasonal allergies Review of Systems  (ROS) Constitutional Symptoms (General Health) Denies complaints or symptoms of Fatigue, Fever, Chills, Marked Weight Change. Eyes Complains or has symptoms of Glasses / Contacts - glasses. Denies complaints or symptoms of Dry Eyes, Vision Changes. Respiratory Denies complaints or symptoms of Chronic or frequent coughs, Shortness of Breath. Cardiovascular Denies complaints or symptoms of Chest pain. Gastrointestinal Denies complaints or symptoms of Frequent diarrhea, Nausea, Vomiting. Endocrine Denies complaints or symptoms of Heat/cold intolerance. Genitourinary Denies complaints or symptoms of Frequent urination. Integumentary (Skin) Left dorsal ft Musculoskeletal Denies complaints or symptoms of Muscle Pain, Muscle Weakness. Neurologic Denies complaints or symptoms of Numbness/parasthesias. Psychiatric Denies complaints or symptoms of Claustrophobia. Objective Constitutional respirations regular, non-labored and within target range for patient.. Vitals Time Taken: 7:54 AM, Height: 64 in, Source: Stated, Weight: 170 lbs, Source: Stated, BMI: 29.2, Temperature: 97.9 F, Pulse: 81 bpm, Respiratory Rate: 16 breaths/min, Blood Pressure: 113/67 mmHg. Cardiovascular 2+ dorsalis pedis/posterior tibialis pulses. Psychiatric pleasant and cooperative. General Notes: Left foot: T the dorsal aspect there is Brown laceration with undermining to the inferior portion. Granulation tissue with dried old blood. Small piece o of glass removed. No signs of surrounding infection. Mild tenderness to palpation. Integumentary (Hair, Skin) Wound #1 status is Open. Original cause of wound was Laceration. The date acquired was: 07/09/2022. The wound is located on the Left,Dorsal Foot. The wound measures 2.4cm length x 0.5cm width x 0.1cm depth; 0.942cm^2 area and 0.094cm^3 volume. There is no tunneling noted, however, there is undermining starting at 12:00 and ending at 12:00 with Brown maximum distance of  0.8cm. There is Brown medium amount of serosanguineous drainage noted. There is medium (34- 66%) red, pink granulation within the wound bed. There is Brown medium (34-66%) amount of necrotic tissue within the wound bed including Eschar. The periwound skin appearance had no abnormalities noted for texture. Periwound temperature was noted as No Abnormality. The periwound has tenderness on palpation. Assessment Active Problems ICD-10 Unspecified open wound, left foot, initial encounter Other early complications of trauma, initial encounter Laceration with foreign body, left foot, initial encounter Patient presents with Brown 3-week history of nonhealing wound to the left dorsal foot caused by trauma. She has been following closely with urgent care for this issue. She started out with sutures followed by derma clips and now had Steri-Strips in place. She states she is currently on Keflex.Upon inspection I was able to remove Brown piece of glass. At this time I recommended Vashe wet-to-dry dressing changes. Continue to use Ace wrap to offer support and crutches to Renee Brown, Renee Brown ([id number]) [id number] Page 5 of 7 offload. Follow-up in 1 week. Plan Follow-up Appointments: Return Appointment in 1 week. - DR.  Trevaris Pennella Bathing/ Shower/ Hygiene: May shower and wash wound with soap and water. Edema Control - Lymphedema / SCD / Other: Segmental Compressive Device. Use the Segmental Compressive Device on leg(s) 2-3 times Brown day for 45 - 60 minutes. If wearing any wraps or hose, do not remove them. Continue exercising as instructed. Elevate legs to the level of the heart or above for 30 minutes daily and/or when sitting for 3-4 times Brown day throughout the day. Avoid standing for long periods of time. WOUND #1: - Foot Wound Laterality: Dorsal, Left Cleanser: Soap and Water 1 x Per Day/30 Days Discharge Instructions: May shower and wash wound with dial antibacterial soap and water prior to  dressing change. Cleanser: Wound Cleanser 1 x Per Day/30 Days Discharge Instructions: Cleanse the wound with wound cleanser prior to applying Brown clean dressing using gauze sponges, not tissue or cotton balls. Prim Dressing: Vashe 1 x Per Day/30 Days ary Discharge Instructions: Wet to dry dressing. Wet small piece of kerlix with Vashe solution and place over wound. Secondary Dressing: ABD Pad, 8x10 (DME) (Generic) 1 x Per Day/30 Days Discharge Instructions: Apply over primary dressing as directed. Secured With: Elastic Bandage 4 inch (ACE bandage) (DME) (Generic) 1 x Per Day/30 Days Discharge Instructions: Secure with ACE bandage as directed. Secured With: [employer], 4.5x3.1 (in/yd) (DME) (Generic) 1 x Per Day/30 Days Discharge Instructions: Secure with Kerlix as directed. Secured With: Transpore Surgical T ape, 2x10 (in/yd) (Generic) 1 x Per Day/30 Days Discharge Instructions: Secure dressing with tape as directed. 1. Vashe wet-to-dry dressings 2. Aggressive offloadingoocrutches 3. Follow-up in 1 week Electronic Signature(s) Signed: 08/06/2022 3:08:51 PM By: Deon Pilling RN, BSN Signed: 08/10/2022 10:54:50 AM By: Kalman Shan DO Previous Signature: 07/26/2022 10:35:38 AM Version By: Kalman Shan DO Entered By: Deon Pilling on 08/02/2022 14:17:34 -------------------------------------------------------------------------------- HxROS Details Patient Name: Date of Service: Renee Brown RBA [name]. 07/26/2022 7:45 [name] Medical Record Number: [id number] Patient Account Number: [id number] Date of Birth/Sex: Treating RN: Jan 20, 1996 (27 y.o. Iver Nestle, Renee Primary Care Provider: [name] NT Other Clinician: Referring Provider: Treating Provider/Extender: [name], [name] NT Weeks in Treatment: 0 Constitutional Symptoms (General Health) Complaints and Symptoms: Negative for: Fatigue; Fever; Chills; Marked Weight Change Eyes Complaints and Symptoms: Positive for:  Glasses / Contacts - glasses Negative for: Dry Eyes; Vision Changes Medical History: Negative for: Cataracts; Glaucoma; Optic Neuritis Respiratory Complaints and Symptoms: Negative for: Chronic or frequent coughs; Shortness of Breath Medical History: Negative for: Aspiration; Asthma; Chronic Obstructive Pulmonary Disease (COPD); Pneumothorax; Sleep Apnea; Tuberculosis Cardiovascular Complaints and Symptoms: Negative for: Chest pain Renee Brown, Renee Brown ([id number]) [id number] Page 6 of 7 Medical History: Negative for: Angina; Arrhythmia; Congestive Heart Failure; Coronary Artery Disease; Deep Vein Thrombosis; Hypertension; Hypotension; Myocardial Infarction; Peripheral Arterial Disease; Peripheral Venous Disease; Phlebitis; Vasculitis Gastrointestinal Complaints and Symptoms: Negative for: Frequent diarrhea; Nausea; Vomiting Medical History: Negative for: Cirrhosis ; Colitis; Crohns; Hepatitis Brown; Hepatitis B; Hepatitis C Endocrine Complaints and Symptoms: Negative for: Heat/cold intolerance Medical History: Negative for: Type I Diabetes Genitourinary Complaints and Symptoms: Negative for: Frequent urination Medical History: Negative for: End Stage Renal Disease Musculoskeletal Complaints and Symptoms: Negative for: Muscle Pain; Muscle Weakness Neurologic Complaints and Symptoms: Negative for: Numbness/parasthesias Psychiatric Complaints and Symptoms: Negative for: Claustrophobia Medical History: Negative for: Anorexia/bulimia; Confinement Anxiety Ear/Nose/Mouth/Throat Medical History: Negative for: Chronic sinus problems/congestion; Middle ear problems Past Medical History Notes: seasonal allergies Hematologic/Lymphatic Immunological Medical History: Negative for: Lupus Erythematosus; Raynauds; Scleroderma Integumentary (Skin) Complaints and Symptoms:  Review of System Notes: Left dorsal ft Oncologic Immunizations Pneumococcal  Vaccine: Received Pneumococcal Vaccination: No Implantable Devices None Family and Social History Cancer: Yes - Maternal Grandparents; Diabetes: Yes - Maternal Grandparents; Heart Disease: Yes - Maternal Grandparents; Hereditary Spherocytosis: No; Hypertension: Yes - Mother; Kidney Disease: No; Lung Disease: No; Seizures: No; Stroke: Yes - Paternal Grandparents; Thyroid Problems: No; Tuberculosis: Renee Brown, Renee Brown ([id number]) [id number] Page 7 of 7 No; Never smoker; Marital Status - Married; Alcohol Use: Rarely; Drug Use: No History; Caffeine Use: Daily; Financial Concerns: No; Food, Clothing or Shelter Needs: No; Support System Lacking: No; Transportation Concerns: No Physician Affirmation I have reviewed and agree with the above information. Electronic Signature(s) Signed: 07/26/2022 10:35:38 AM By: Kalman Shan DO Signed: 07/28/2022 4:23:23 PM By: Blanche East RN Entered By: Blanche East on 07/26/2022 08:00:30 -------------------------------------------------------------------------------- SuperBill Details Patient Name: Date of Service: Renee Brown RBA [name]. 07/26/2022 Medical Record Number: [id number] Patient Account Number: [id number] Date of Birth/Sex: Treating RN: 1996/01/22 (27 y.o. Marta Lamas Primary Care Provider: [name] NT Other Clinician: Referring Provider: Treating Provider/Extender: [name], [name] NT Weeks in Treatment: 0 Diagnosis Coding ICD-10 Codes Code Description T79.8XXA Other early complications of trauma, initial encounter S91.302A Unspecified open wound, left foot, initial encounter [id number] Laceration with foreign body, left foot, initial encounter Facility Procedures : CPT4 Code: [id number] Description: 99214 - WOUND CARE VISIT-LEV 4 EST PT Modifier: 25 Quantity: 1 Physician Procedures : CPT4 Code Description Modifier [id number] - WC PHYS LEVEL 4 - NEW PT ICD-10 Diagnosis Description T79.8XXA  Other early complications of trauma, initial encounter S91.302A Unspecified open wound, left foot, initial encounter [id number] Laceration with  foreign body, left foot, initial encounter Quantity: 1 Electronic Signature(s) Signed: 07/26/2022 10:35:38 AM By: Kalman Shan DO Entered By: Kalman Shan on 07/26/2022 09:32:00

## 2022-07-29 NOTE — Progress Notes (Signed)
Renee Brown, Renee Brown ([id number]) [phone].pdf Page 1 of 4 Visit Report for 07/26/2022 Abuse Risk Screen Details Patient Name: Date of Service: Renee Brown, Renee Brown. 07/26/2022 7:45 [name] Medical Record Number: [id number] Patient Account Number: [id number] Date of Birth/Sex: Treating RN: [birth year] (27 y.o. Renee Brown Primary Care Renee Brown: Renee Brown NT Other Clinician: Referring Renee Brown: Treating Renee Brown/Extender: Renee Brown in Treatment: 0 Abuse Risk Screen Items Answer ABUSE RISK SCREEN: Has anyone close to you tried to hurt or harm you recentlyo No Do you feel uncomfortable with anyone in your familyo No Has anyone forced you do things that you didnt want to doo No Electronic Signature(s) Signed: 07/28/2022 4:23:23 PM By: Renee East RN Entered By: Renee Brown on 07/26/2022 08:00:51 -------------------------------------------------------------------------------- Activities of Daily Living Details Patient Name: Date of Service: Renee Brown, Renee Brown. 07/26/2022 7:45 [name] Medical Record Number: [id number] Patient Account Number: [id number] Date of Birth/Sex: Treating RN: [birth year] (27 y.o. Renee Brown Primary Care Renee Brown: Renee Brown NT Other Clinician: Referring Renee Brown: Treating Renee Brown/Extender: Renee Brown in Treatment: 0 Activities of Daily Living Items Answer Activities of Daily Living (Please select one for each item) Drive Automobile Need Assistance T Medications ake Completely Able Use T elephone Completely Able Care for Appearance Completely Able Use T oilet Completely Able Bath / Shower Completely Able Dress Self Completely Able Feed Self Completely Able Walk Completely Able Get In / Out Bed Completely Able Housework Completely Able Prepare Meals Completely Able Handle Money Completely Able Shop for Self Completely Able Electronic  Signature(s) Signed: 07/28/2022 4:23:23 PM By: Renee East RN Entered By: Renee Brown on 07/26/2022 08:01:25 Renee Brown ([id number]) [id number] Page 2 of 4 -------------------------------------------------------------------------------- Education Screening Details Patient Name: Date of Service: Renee Brown. 07/26/2022 7:45 [name] Medical Record Number: [id number] Patient Account Number: [id number] Date of Birth/Sex: Treating RN: 04/18/1996 (27 y.o. Renee Brown Primary Care Renee Brown: Renee Brown NT Other Clinician: Referring Renee Brown: Treating Renee Brown/Extender: Renee Brown in Treatment: 0 Primary Learner Assessed: Patient Learning Preferences/Education Level/Primary Language Learning Preference: Explanation Highest Education Level: College or Above Preferred Language: English Cognitive Barrier Language Barrier: No Translator Needed: No Memory Deficit: No Emotional Barrier: No Cultural/Religious Beliefs Affecting Medical Care: No Physical Barrier Impaired Vision: Yes Glasses, Contacts Impaired Hearing: No Decreased Hand dexterity: No Knowledge/Comprehension Knowledge Level: High Comprehension Level: High Ability to understand written instructions: High Ability to understand verbal instructions: High Motivation Anxiety Level: Calm Cooperation: Cooperative Education Importance: Acknowledges Need Interest in Health Problems: Asks Questions Perception: Coherent Willingness to Engage in Self-Management High Activities: Readiness to Engage in Self-Management High Activities: Electronic Signature(s) Signed: 07/28/2022 4:23:23 PM By: Renee East RN Entered By: Renee Brown on 07/26/2022 08:02:25 -------------------------------------------------------------------------------- Fall Risk Assessment Details Patient Name: Date of Service: Renee Brown, Renee Brown. 07/26/2022 7:45 [name] Medical  Record Number: [id number] Patient Account Number: [id number] Date of Birth/Sex: Treating RN: 02-15-1996 (27 y.o. Renee Brown Primary Care Renee Brown: Renee Brown NT Other Clinician: Referring Renee Brown: Treating Renee Brown/Extender: Renee Brown in Treatment: 0 Fall Risk Assessment Items Have you had 2 or more falls in the last 12 monthso 0 No Renee Brown, Renee Brown ([id number]) [id number] Page 3 of 4 Have you had any fall that resulted in injury in the last 12 monthso 0 No FALLS RISK SCREEN History of falling - immediate or within 3 months 25 Yes Secondary diagnosis (Do you have 2  or more medical diagnoseso) 0 No Ambulatory aid None/bed rest/wheelchair/nurse 0 Yes Crutches/cane/walker 0 No Furniture 0 No Intravenous therapy Access/Saline/Heparin Lock 0 No Gait/Transferring Normal/ bed rest/ wheelchair 0 Yes Weak (short steps with or without shuffle, stooped but able to lift head while walking, may seek 0 No support from furniture) Impaired (short steps with shuffle, may have difficulty arising from chair, head down, impaired 0 No balance) Mental Status Oriented to own ability 0 Yes Electronic Signature(s) Signed: 07/28/2022 4:23:23 PM By: Renee East RN Entered By: Renee Brown on 07/26/2022 08:03:08 -------------------------------------------------------------------------------- Foot Assessment Details Patient Name: Date of Service: Renee Brown. 07/26/2022 7:45 [name] Medical Record Number: [id number] Patient Account Number: [id number] Date of Birth/Sex: Treating RN: January 05, 1996 (26 y.o. Renee Brown Primary Care Renee Brown: Renee Brown NT Other Clinician: Referring Renee Brown: Treating Renee Brown/Extender: Renee Brown in Treatment: 0 Foot Assessment Items Site Locations + = Sensation present, - = Sensation absent, C = Callus, U = Ulcer R = Redness, W = Warmth, M =  Maceration, PU = Pre-ulcerative lesion F = Fissure, S = Swelling, D = Dryness Assessment Right: Left: Other Deformity: No No Prior Foot Ulcer: No No Prior Amputation: No No Charcot Joint: No No Ambulatory Status: Ambulatory With Help Assistance Device: Renee Brown, Renee Brown ([id number]) [phone].pdf Page 4 of 4 Gait: Steady Electronic Signature(s) Signed: 07/28/2022 4:23:23 PM By: Renee East RN Entered By: Renee Brown on 07/26/2022 08:04:13 -------------------------------------------------------------------------------- Nutrition Risk Screening Details Patient Name: Date of Service: Renee Brown, Renee Brown. 07/26/2022 7:45 [name] Medical Record Number: [id number] Patient Account Number: [id number] Date of Birth/Sex: Treating RN: 1996/01/10 (27 y.o. F) Zochol, Shelton Primary Care Denice Cardon: Renee Brown NT Other Clinician: Referring Paulmichael Schreck: Treating Brittie Whisnant/Extender: Renee Brown in Treatment: 0 Height (in): 64 Weight (lbs): 170 Body Mass Index (BMI): 29.2 Nutrition Risk Screening Items Score Screening NUTRITION RISK SCREEN: I have an illness or condition that made me change the kind and/or amount of food I eat 0 No I eat fewer than two meals per day 0 No I eat few fruits and vegetables, or milk products 0 No I have three or more drinks of beer, liquor or wine almost every day 0 No I have tooth or mouth problems that make it hard for me to eat 0 No I don't always have enough money to buy the food I need 0 No I eat alone most of the time 0 No I take three or more different prescribed or over-the-counter drugs Brown day 0 No Without wanting to, I have lost or gained 10 pounds in the last six months 0 No I am not always physically able to shop, cook and/or feed myself 0 No Nutrition Protocols Good Risk Protocol 0 No interventions needed Moderate Risk Protocol High Risk Proctocol Risk Level: Good Risk Score:  0 Electronic Signature(s) Signed: 07/28/2022 4:23:23 PM By: Renee East RN Entered By: Renee Brown on 07/26/2022 08:03:51

## 2022-07-29 NOTE — Progress Notes (Signed)
Renee, Brown Brown ([id number]) [id number] Page 1 of 9 Visit Report for 07/26/2022 Allergy List Details Patient Name: Date of Service: Renee Brown, Renee Brown [facility] [name]. 07/26/2022 7:45 [name] Medical Record Number: [id number] Patient Account Number: [id number] Date of Birth/Sex: Treating RN: Jul 17, 1995 (27 y.o. Renee Brown Primary Care Renee Brown: Renee Brown NT Other Clinician: Referring Renee Brown: Treating Renee Brown/Extender: Renee Brown in Treatment: 0 Allergies Active Allergies No Known Allergies Allergy Notes Electronic Signature(s) Signed: 07/28/2022 4:23:23 PM By: Renee East RN Entered By: Renee Brown on 07/26/2022 07:55:16 -------------------------------------------------------------------------------- Arrival Information Details Patient Name: Date of Service: Renee Brown RBA [name]. 07/26/2022 7:45 [name] Medical Record Number: [id number] Patient Account Number: [id number] Date of Birth/Sex: Treating RN: 05-10-1996 (27 y.o. Renee Brown Primary Care Renee Brown: Renee Brown NT Other Clinician: Referring Renee Brown: Treating Renee Brown/Extender: Renee Brown in Treatment: 0 Visit Information Patient Arrived: Crutches Arrival Time: 07:53 Accompanied By: husband Transfer Assistance: None Patient Identification Verified: Yes Secondary Verification Process Completed: Yes Patient Requires Transmission-Based Precautions: No Patient Has Alerts: No Electronic Signature(s) Signed: 07/28/2022 4:23:23 PM By: Renee East RN Entered By: Renee Brown on 07/26/2022 07:54:37 -------------------------------------------------------------------------------- Clinic Level of Care Assessment Details Patient Name: Date of Service: Renee Brown, Renee [name]. 07/26/2022 7:45 [name] Medical Record Number: [id number] Patient Account Number: [id number] Renee, RAUM Brown ([id number]) [phone].pdf Page 2  of 9 Date of Birth/Sex: Treating RN: 1995-09-09 (27 y.o. Renee Brown Primary Care Sujey Gundry: Renee Brown NT Other Clinician: Referring Renee Brown: Treating Renee Brown/Extender: Renee Brown in Treatment: 0 Clinic Level of Care Assessment Items TOOL 2 Quantity Score X- 1 0 Use when only an EandM is performed on the INITIAL visit ASSESSMENTS - Nursing Assessment / Reassessment X- 1 20 General Physical Exam (combine w/ comprehensive assessment (listed just below) when performed on new pt. evals) X- 1 25 Comprehensive Assessment (HX, ROS, Risk Assessments, Wounds Hx, etc.) ASSESSMENTS - Wound and Skin Brown ssessment / Reassessment X - Simple Wound Assessment / Reassessment - one wound 1 5 []$  - 0 Complex Wound Assessment / Reassessment - multiple wounds []$  - 0 Dermatologic / Skin Assessment (not related to wound area) ASSESSMENTS - Ostomy and/or Continence Assessment and Care []$  - 0 Incontinence Assessment and Management []$  - 0 Ostomy Care Assessment and Management (repouching, etc.) PROCESS - Coordination of Care X - Simple Patient / Family Education for ongoing care 1 15 []$  - 0 Complex (extensive) Patient / Family Education for ongoing care X- 1 10 Staff obtains Programmer, systems, Records, T Results / Process Orders est X- 1 10 Staff telephones HHA, Nursing Homes / Clarify orders / etc []$  - 0 Routine Transfer to another Facility (non-emergent condition) []$  - 0 Routine Brown Admission (non-emergent condition) X- 1 15 New Admissions / Biomedical engineer / Ordering NPWT Apligraf, etc. , []$  - 0 Emergency Brown Admission (emergent condition) []$  - 0 Simple Discharge Coordination []$  - 0 Complex (extensive) Discharge Coordination PROCESS - Special Needs []$  - 0 Pediatric / Minor Patient Management []$  - 0 Isolation Patient Management []$  - 0 Hearing / Language / Visual special needs []$  - 0 Assessment of Community assistance (transportation,  D/C planning, etc.) []$  - 0 Additional assistance / Altered mentation []$  - 0 Support Surface(s) Assessment (bed, cushion, seat, etc.) INTERVENTIONS - Wound Cleansing / Measurement X- 1 5 Wound Imaging (photographs - any number of wounds) []$  - 0 Wound Tracing (instead of photographs) X- 1 5 Simple Wound Measurement - one wound []$  -  0 Complex Wound Measurement - multiple wounds []$  - 0 Simple Wound Cleansing - one wound []$  - 0 Complex Wound Cleansing - multiple wounds INTERVENTIONS - Wound Dressings X - Small Wound Dressing one or multiple wounds 1 10 []$  - 0 Medium Wound Dressing one or multiple wounds []$  - 0 Large Wound Dressing one or multiple wounds []$  - 0 Application of Medications - injection Renee, Brown Brown ([id number]) [phone].pdf Page 3 of 9 INTERVENTIONS - Miscellaneous []$  - 0 External ear exam []$  - 0 Specimen Collection (cultures, biopsies, blood, body fluids, etc.) []$  - 0 Specimen(s) / Culture(s) sent or taken to Lab for analysis []$  - 0 Patient Transfer (multiple staff / Harrel Lemon Lift / Similar devices) []$  - 0 Simple Staple / Suture removal (25 or less) []$  - 0 Complex Staple / Suture removal (26 or more) []$  - 0 Hypo / Hyperglycemic Management (close monitor of Blood Glucose) []$  - 0 Ankle / Brachial Index (ABI) - do not check if billed separately Has the patient been seen at the Brown within the last three years: Yes Total Score: 120 Level Of Care: New/Established - Level 4 Electronic Signature(s) Signed: 07/28/2022 4:23:23 PM By: Renee East RN Entered By: Renee Brown on 07/26/2022 09:00:26 -------------------------------------------------------------------------------- Encounter Discharge Information Details Patient Name: Date of Service: Renee Brown RBA [name]. 07/26/2022 7:45 [name] Medical Record Number: [id number] Patient Account Number: [id number] Date of Birth/Sex: Treating RN: Sep 18, 1995 (27 y.o. Renee Brown Primary  Care Talon Witting: Renee Brown NT Other Clinician: Referring Shakthi Scipio: Treating Keyontae Huckeby/Extender: Renee Brown in Treatment: 0 Encounter Discharge Information Items Discharge Condition: Stable Ambulatory Status: Crutches Discharge Destination: Home Transportation: Private Auto Accompanied By: Husband Schedule Follow-up Appointment: Yes Clinical Summary of Care: Electronic Signature(s) Signed: 07/28/2022 4:23:23 PM By: Renee East RN Entered By: Renee Brown on 07/26/2022 09:01:12 -------------------------------------------------------------------------------- Lower Extremity Assessment Details Patient Name: Date of Service: Renee Brown, Renee Brown [name]. 07/26/2022 7:45 [name] Medical Record Number: [id number] Patient Account Number: [id number] Date of Birth/Sex: Treating RN: 11-Dec-1995 (27 y.o. Renee Brown Primary Care Salvador Coupe: Renee Brown NT Other Clinician: Referring Ashely Goosby: Treating Amil Bouwman/Extender: Renee Brown in Treatment: 0 Edema Assessment Assessed: [Left: No] [Right: No] [Left: Edema] [Right: :] B[LeftHILDER, MATTIELLO Brown [id number] [Right: [id number] Page 4 of 9] Calf Left: Right: Point of Measurement: From Medial Instep 38.5 cm Ankle Left: Right: Point of Measurement: From Medial Instep 24 cm Vascular Assessment Pulses: Dorsalis Pedis Palpable: [Left:Yes] Electronic Signature(s) Signed: 07/28/2022 4:23:23 PM By: Renee East RN Entered By: Renee Brown on 07/26/2022 08:06:46 -------------------------------------------------------------------------------- Multi Wound Chart Details Patient Name: Date of Service: Renee Brown RBA [name]. 07/26/2022 7:45 [name] Medical Record Number: [id number] Patient Account Number: [id number] Date of Birth/Sex: Treating RN: 01/18/1996 (27 y.o. F) Primary Care Aseel Uhde: Renee Brown NT Other Clinician: Referring Mikie Misner: Treating  Coletta Lockner/Extender: Renee Brown in Treatment: 0 Vital Signs Height(in): 64 Pulse(bpm): 81 Weight(lbs): 170 Blood Pressure(mmHg): 113/67 Body Mass Index(BMI): 29.2 Temperature(F): 97.9 Respiratory Rate(breaths/min): 16 [1:Photos:] [N/Brown:N/Brown] Left, Dorsal Foot N/Brown N/Brown Wound Location: Laceration N/Brown N/Brown Wounding Event: Trauma, Other N/Brown N/Brown Primary Etiology: 07/09/2022 N/Brown N/Brown Date Acquired: 0 N/Brown N/Brown Weeks of Treatment: Open N/Brown N/Brown Wound Status: No N/Brown N/Brown Wound Recurrence: 2.4x0.5x0.1 N/Brown N/Brown Measurements L x W x D (cm) 0.942 N/Brown N/Brown Brown (cm) : rea 0.094 N/Brown N/Brown Volume (cm) : 12 Starting Position 1 (o'clock): 12 Ending Position 1 (o'clock): 0.8 Maximum Distance 1 (cm): Yes N/Brown  N/Brown Undermining: Full Thickness Without Exposed N/Brown N/Brown Classification: Support Structures Medium N/Brown N/Brown Exudate Amount: Serosanguineous N/Brown N/Brown Exudate Type: red, brown N/Brown N/Brown Exudate Color: Medium (34-66%) N/Brown N/Brown Granulation Amount: Red, Pink N/Brown N/Brown Granulation QualityDAMIAN, BALDASSARRE Brown ([id number]) [phone].pdf Page 5 of 9 Medium (34-66%) N/Brown N/Brown Necrotic Brown mount: Eschar N/Brown N/Brown Necrotic Tissue: Small (1-33%) N/Brown N/Brown Epithelialization: No Abnormalities Noted N/Brown N/Brown Periwound Skin Texture: No Abnormality N/Brown N/Brown Temperature: Yes N/Brown N/Brown Tenderness on Palpation: Treatment Notes Wound #1 (Foot) Wound Laterality: Dorsal, Left Cleanser Soap and Water Discharge Instruction: May shower and wash wound with dial antibacterial soap and water prior to dressing change. Wound Cleanser Discharge Instruction: Cleanse the wound with wound cleanser prior to applying Brown clean dressing using gauze sponges, not tissue or cotton balls. Peri-Wound Care Topical Primary Dressing Vashe Discharge Instruction: Wet to dry dressing Secondary Dressing ABD Pad, 8x10 Discharge Instruction: Apply over primary dressing as  directed. Secured With Elastic Bandage 4 inch (ACE bandage) Discharge Instruction: Secure with ACE bandage as directed. Kerlix Roll Sterile, 4.5x3.1 (in/yd) Discharge Instruction: Secure with Kerlix as directed. Transpore Surgical Tape, 2x10 (in/yd) Discharge Instruction: Secure dressing with tape as directed. Compression Wrap Compression Stockings Add-Ons Electronic Signature(s) Signed: 07/26/2022 10:35:38 AM By: Kalman Shan DO Entered By: Kalman Shan on 07/26/2022 09:21:47 -------------------------------------------------------------------------------- Multi-Disciplinary Care Plan Details Patient Name: Date of Service: Renee Brown RBA [name]. 07/26/2022 7:45 [name] Medical Record Number: [id number] Patient Account Number: [id number] Date of Birth/Sex: Treating RN: March 20, 1996 (27 y.o. Renee Brown Primary Care Tascha Casares: Renee Brown NT Other Clinician: Referring [place] Shellhammer: Treating Carles Florea/Extender: Renee Brown in Treatment: 0 Active Inactive Nutrition Nursing Diagnoses: Potential for alteratiion in Nutrition/Potential for imbalanced nutrition Goals: AXEL, HACKETT Brown ([id number]) [phone].pdf Page 6 of 9 Patient/caregiver agrees to and verbalizes understanding of need to use nutritional supplements and/or vitamins as prescribed Date Initiated: 07/26/2022 Target Resolution Date: 09/02/2022 Goal Status: Active Interventions: Provide education on nutrition Notes: Orientation to the Wound Care Program Nursing Diagnoses: Knowledge deficit related to the wound healing center program Goals: Patient/caregiver will verbalize understanding of the [place] [facility] Date Initiated: 07/26/2022 Target Resolution Date: 08/12/2022 Goal Status: Active Interventions: Provide education on orientation to the wound center Notes: Wound/Skin Impairment Nursing Diagnoses: Knowledge deficit related to  ulceration/compromised skin integrity Goals: Ulcer/skin breakdown will have Brown volume reduction of 30% by week 4 Date Initiated: 07/26/2022 Target Resolution Date: 09/02/2022 Goal Status: Active Interventions: Assess ulceration(s) every visit Provide education on ulcer and skin care Notes: Electronic Signature(s) Signed: 07/28/2022 4:23:23 PM By: Renee East RN Entered By: Renee Brown on 07/26/2022 08:15:23 -------------------------------------------------------------------------------- Pain Assessment Details Patient Name: Date of Service: Renee Brown [name]. 07/26/2022 7:45 [name] Medical Record Number: [id number] Patient Account Number: [id number] Date of Birth/Sex: Treating RN: 1996/04/27 (27 y.o. Renee Brown Primary Care Amad Mau: Renee Brown NT Other Clinician: Referring Shimon Trowbridge: Treating Hartwell Vandiver/Extender: Renee Brown in Treatment: 0 Active Problems Location of Pain Severity and Description of Pain Patient Has Paino Yes Site Locations Pain LocationANALEISE, HARAKAL Brown ([id number]) [phone].pdf Page 7 of 9 Pain Location: Pain in Ulcers With Dressing Change: Yes Rate the pain. Current Pain Level: 4 Character of Pain Describe the Pain: Aching, Throbbing Pain Management and Medication Current Pain Management: Electronic Signature(s) Signed: 07/28/2022 4:23:23 PM By: Renee East RN Entered By: Renee Brown on 07/26/2022 08:12:02 -------------------------------------------------------------------------------- Patient/Caregiver Education Details Patient Name: Date of Service: Renee Brown, Renee RBA [name]. 2/19/2024andnbsp7:45  [name] Medical Record Number: [id number] Patient Account Number: [id number] Date of Birth/Gender: Treating RN: 07-Dec-1995 (27 y.o. Renee Brown Primary Care Physician: Renee Brown NT Other Clinician: Referring Physician: Treating Physician/Extender: Renee Brown in Treatment: 0 Education Assessment Education Provided To: Patient Education Topics Provided Welcome T [facility]New Patient Packet: o Methods: Explain/Verbal Responses: Reinforcements needed, State content correctly Wound Debridement: Methods: Explain/Verbal Responses: Reinforcements needed, State content correctly Wound/Skin Impairment: Methods: Explain/Verbal Responses: Reinforcements needed, State content correctly Electronic Signature(s) Signed: 07/28/2022 4:23:23 PM By: Renee East RN Entered By: Renee Brown on 07/26/2022 08:18:05 Valarie Merino Brown ([id number]) [id number] Page 8 of 9 -------------------------------------------------------------------------------- Wound Assessment Details Patient Name: Date of Service: Renee Brown, Renee [name]. 07/26/2022 7:45 [name] Medical Record Number: [id number] Patient Account Number: [id number] Date of Birth/Sex: Treating RN: 07-05-1995 (27 y.o. Iver Nestle, [place] Primary Care Dalaya Suppa: Salvadore Brown NT Other Clinician: Referring Jarrah Seher: Treating Jefry Lesinski/Extender: Renee Brown in Treatment: 0 Wound Status Wound Number: 1 Primary Etiology: Trauma, Other Wound Location: Left, Dorsal Foot Wound Status: Open Wounding Event: Laceration Date Acquired: 07/09/2022 Weeks Of Treatment: 0 Clustered Wound: No Photos Wound Measurements Length: (cm) 2.4 Width: (cm) 0.5 Depth: (cm) 0.1 Area: (cm) 0.942 Volume: (cm) 0.094 % Reduction in Area: % Reduction in Volume: Epithelialization: Small (1-33%) Tunneling: No Undermining: Yes Starting Position (o'clock): 12 Ending Position (o'clock): 12 Maximum Distance: (cm) 0.8 Wound Description Classification: Full Thickness Without Exposed Suppor Exudate Amount: Medium Exudate Type: Serosanguineous Exudate Color: red, brown t Structures Foul Odor After Cleansing: No Slough/Fibrino Yes Wound Bed Granulation  Amount: Medium (34-66%) Granulation Quality: Red, Pink Necrotic Amount: Medium (34-66%) Necrotic Quality: Eschar Periwound Skin Texture Texture Color No Abnormalities Noted: Yes No Abnormalities Noted: No Moisture Temperature / Pain No Abnormalities Noted: No Temperature: No Abnormality Tenderness on Palpation: Yes Treatment Notes Wound #1 (Foot) Wound Laterality: Dorsal, Left Cleanser Soap and Water SOLYANA, VILLENA Brown ([id number]) [id number] Page 9 of 9 Discharge Instruction: May shower and wash wound with dial antibacterial soap and water prior to dressing change. Wound Cleanser Discharge Instruction: Cleanse the wound with wound cleanser prior to applying Brown clean dressing using gauze sponges, not tissue or cotton balls. Peri-Wound Care Topical Primary Dressing Vashe Discharge Instruction: Wet to dry dressing Secondary Dressing ABD Pad, 8x10 Discharge Instruction: Apply over primary dressing as directed. Secured With Elastic Bandage 4 inch (ACE bandage) Discharge Instruction: Secure with ACE bandage as directed. Kerlix Roll Sterile, 4.5x3.1 (in/yd) Discharge Instruction: Secure with Kerlix as directed. Transpore Surgical Tape, 2x10 (in/yd) Discharge Instruction: Secure dressing with tape as directed. Compression Wrap Compression Stockings Add-Ons Electronic Signature(s) Signed: 07/28/2022 4:23:23 PM By: Renee East RN Entered By: Renee Brown on 07/26/2022 08:45:13 -------------------------------------------------------------------------------- Vitals Details Patient Name: Date of Service: Renee Brown RBA [name]. 07/26/2022 7:45 [name] Medical Record Number: [id number] Patient Account Number: [id number] Date of Birth/Sex: Treating RN: Jan 07, 1996 (27 y.o. F) Renee Brown, Renee Brown Primary Care Kassius Battiste: Renee Brown NT Other Clinician: Referring Eavan Gonterman: Treating Loreley Schwall/Extender: Renee Brown in Treatment: 0 Vital  Signs Time Taken: 07:54 Temperature (F): 97.9 Height (in): 64 Pulse (bpm): 81 Source: Stated Respiratory Rate (breaths/min): 16 Weight (lbs): 170 Blood Pressure (mmHg): 113/67 Source: Stated Reference Range: 80 - 120 mg / dl Body Mass Index (BMI): 29.2 Electronic Signature(s) Signed: 07/28/2022 4:23:23 PM By: Renee East RN Entered By: Renee Brown on 07/26/2022 07:55:05

## 2022-08-03 ENCOUNTER — Encounter (HOSPITAL_BASED_OUTPATIENT_CLINIC_OR_DEPARTMENT_OTHER): Payer: Managed Care, Other (non HMO) | Admitting: Internal Medicine

## 2022-08-03 DIAGNOSIS — S91322A Laceration with foreign body, left foot, initial encounter: Secondary | ICD-10-CM | POA: Diagnosis not present

## 2022-08-03 DIAGNOSIS — S91302A Unspecified open wound, left foot, initial encounter: Secondary | ICD-10-CM

## 2022-08-03 DIAGNOSIS — T798XXA Other early complications of trauma, initial encounter: Secondary | ICD-10-CM | POA: Diagnosis not present

## 2022-08-04 NOTE — Progress Notes (Signed)
Renee [name], Renee [name] ([id number]) [id number] Page 1 of 8 Visit Report for 08/03/2022 Chief Complaint Document Details Patient Name: Date of Service: DIA, LUC [facility] [name]. 08/03/2022 8:15 [name] Medical Record Number: [id number] Patient Account Number: [id number] Date of Birth/Sex: Treating RN: June 10, 1995 (27 y.o. F) Primary Care Provider: [name] NT Other Clinician: Referring Provider: Treating Provider/Extender: [name], [name] NT Weeks in Treatment: 1 Information Obtained from: Patient Chief Complaint 07/26/2022; left dorsal foot wound Electronic Signature(s) Signed: 08/03/2022 12:29:31 PM By: Kalman Shan DO Entered By: Kalman Shan on 08/03/2022 09:57:08 -------------------------------------------------------------------------------- Debridement Details Patient Name: Date of Service: Renee [name] RBA [name]. 08/03/2022 8:15 [name] Medical Record Number: [id number] Patient Account Number: [id number] Date of Birth/Sex: Treating RN: [birth year] (27 y.o. Donalda Ewings Primary Care Provider: [name] NT Other Clinician: Referring Provider: Treating Provider/Extender: [name], [name] NT Weeks in Treatment: 1 Debridement Performed for Assessment: Wound #1 Left,Dorsal Foot Performed By: Physician Kalman Shan, DO Debridement Type: Debridement Level of Consciousness (Pre-procedure): Awake and Alert Pre-procedure Verification/Time Out Yes - 08:40 Taken: Start Time: 08:43 Pain Control: Lidocaine 5% topical ointment T Area Debrided (L x W): otal 0.6 (cm) x 2.2 (cm) = 1.32 (cm) Tissue and other material debrided: Non-Viable, Slough, Subcutaneous, Slough Level: Skin/Subcutaneous Tissue Debridement Description: Excisional Instrument: Curette Bleeding: Minimum Hemostasis Achieved: Pressure Procedural Pain: 0 Post Procedural Pain: 0 Response to Treatment: Procedure was tolerated well Level of Consciousness (Post- Awake and  Alert procedure): Post Debridement Measurements of Total Wound Length: (cm) 0.6 Width: (cm) 2.2 Depth: (cm) 0.1 Volume: (cm) 0.104 Character of Wound/Ulcer Post Debridement: Improved Post Procedure Diagnosis Same as Pre-procedure Notes Scribed for Dr Heber [place] by Sharyn Creamer, RN Electronic Signature(s) Signed: 08/03/2022 12:29:31 PM By: Kalman Shan DO Signed: 08/03/2022 4:02:57 PM By: Sharyn Creamer RN, BSN Entered By: Sharyn Creamer on 08/03/2022 08:45:49 Renee, [name] [name] ([id number]) [id number] Page 2 of 8 -------------------------------------------------------------------------------- HPI Details Patient Name: Date of Service: Renee [name], Renee [name] [name]. 08/03/2022 8:15 [name] Medical Record Number: [id number] Patient Account Number: [id number] Date of Birth/Sex: Treating RN: 12-28-1995 (27 y.o. F) Primary Care Provider: [name] NT Other Clinician: Referring Provider: Treating Provider/Extender: [name], [name] NT Weeks in Treatment: 1 History of Present Illness HPI Description: 07/26/2022 [name] is [name] 27 year old female overall healthy individual that presents to the clinic for [name] trauma wound to her left dorsal foot. She states that she dropped glassware on her foot creating an open wound. She visited urgent care for this issue. She had sutures placed. She was initially started on Augmentin however could not tolerate this and was switched to Keflex. On [name] subsequent wound care visit to urgent care she had an x-ray that showed [name] retained object. She eventually had sutures removed and had 3 derma clips placed. These were removed yesterday in urgent care. She has been using mupirocin ointment to the wound bed. She currently denies signs of infection. She is using crutches to help ambulate and work remove pressure from the foot. 2/27; patient presents for follow-up. She finished her course of Keflex. She has been using Vashe  wet-to-dry dressings. She currently denies systemic signs of infection. Electronic Signature(s) Signed: 08/03/2022 12:29:31 PM By: Kalman Shan DO Entered By: Kalman Shan on 08/03/2022 09:58:33 -------------------------------------------------------------------------------- Physical Exam Details Patient Name: Date of Service: Renee [name] RBA [name]. 08/03/2022 8:15 [name] Medical Record Number: [id number] Patient Account Number: [id number] Date of Birth/Sex: Treating RN: 1995/11/27 (27 y.o. F) Primary Care  Provider: [name] NT Other Clinician: Referring Provider: Treating Provider/Extender: [name], [name] NT Weeks in Treatment: 1 Constitutional respirations regular, non-labored and within target range for patient.. Cardiovascular 2+ dorsalis pedis/posterior tibialis pulses. Psychiatric pleasant and cooperative. Notes Left foot: T the dorsal aspect there is [name] laceration with undermining to the inferior portion. Granulation tissue present although does not appear healthy. Mild o tenderness to palpation. Electronic Signature(s) Signed: 08/03/2022 12:29:31 PM By: Kalman Shan DO Entered By: Kalman Shan on 08/03/2022 09:59:35 -------------------------------------------------------------------------------- Physician Orders Details Patient Name: Date of Service: Renee [name] RBA [name]. 08/03/2022 8:15 [name] Medical Record Number: [id number] Patient Account Number: [id number] Date of Birth/Sex: Treating RN: 1996/04/21 (27 y.o. Donalda Ewings Primary Care Provider: [name] NT Other Clinician: Referring Provider: Treating Provider/Extender: [name], [name] NT Weeks in Treatment: 1 Verbal / Phone Orders: No Diagnosis Coding MARCUS, SANTIN [name] ([id number]) [id number] Page 3 of 8 Follow-up Appointments ppointment in 1 week. - DR. Heber Clarks Return [name] Bathing/ Shower/ Hygiene May shower and wash wound with soap and  water. Edema Control - Lymphedema / SCD / Other Left Lower Extremity Segmental Compressive Device. Use the Segmental Compressive Device on leg(s) 2-3 times [name] day for 45 - 60 minutes. If wearing any wraps or hose, do not remove them. Continue exercising as instructed. Elevate legs to the level of the heart or above for 30 minutes daily and/or when sitting for 3-4 times [name] day throughout the day. Avoid standing for long periods of time. Wound Treatment Wound #1 - Foot Wound Laterality: Dorsal, Left Cleanser: Vashe 5.8 (oz) 1 x Per Day/30 Days Discharge Instructions: Cleanse the wound with Vashe prior to applying [name] clean dressing using gauze sponges, not tissue or cotton balls. Prim Dressing: Hydrofera Blue Ready Transfer Foam, 2.5x2.5 (in/in) 1 x Per Day/30 Days ary Discharge Instructions: Apply directly to wound bed as directed Prim Dressing: MediHoney Calcium Alginate Dressing 4x5 in 1 x Per Day/30 Days ary Discharge Instructions: Apply to wound bed as instructed Secondary Dressing: ABD Pad, 8x10 (Generic) 1 x Per Day/30 Days Discharge Instructions: Apply over primary dressing as directed. Secured With: Elastic Bandage 4 inch (ACE bandage) (Generic) 1 x Per Day/30 Days Discharge Instructions: Secure with ACE bandage as directed. Secured With: [employer], 4.5x3.1 (in/yd) (Generic) 1 x Per Day/30 Days Discharge Instructions: Secure with Kerlix as directed. Secured With: Transpore Surgical Tape, 2x10 (in/yd) (Generic) 1 x Per Day/30 Days Discharge Instructions: Secure dressing with tape as directed. Patient Medications llergies: No Known Allergies [name] Notifications Medication Indication Start End prior to debridement 08/03/2022 lidocaine DOSE topical 5 % ointment - ointment topical once daily 08/03/2022 doxycycline hyclate DOSE 1 - oral 100 mg tablet - 1 tablet oral twice [name] day x 10 days Electronic Signature(s) Signed: 08/03/2022 12:29:31 PM By: Kalman Shan DO Previous  Signature: 08/03/2022 9:24:56 AM Version By: Kalman Shan DO Entered By: Kalman Shan on 08/03/2022 09:59:44 -------------------------------------------------------------------------------- Problem List Details Patient Name: Date of Service: Renee [name] RBA [name]. 08/03/2022 8:15 [name] Medical Record Number: [id number] Patient Account Number: [id number] Date of Birth/Sex: Treating RN: 08/05/1995 (27 y.o. F) Primary Care Provider: [name] NT Other Clinician: Referring Provider: Treating Provider/Extender: [name], [name] NT Weeks in Treatment: 1 Active Problems ICD-10 Encounter Code Description Active Date MDM Diagnosis S91.302A Unspecified open wound, left foot, initial encounter 07/26/2022 No Yes T79.8XXA Other early complications of trauma, initial encounter 07/26/2022 No Yes WANDA, OBERLANDER [name] ([id number]) [id number] Page 4 of 8  [id number] Laceration with foreign body, left foot, initial encounter 07/26/2022 No Yes Inactive Problems Resolved Problems Electronic Signature(s) Signed: 08/03/2022 12:29:31 PM By: Kalman Shan DO Entered By: Kalman Shan on 08/03/2022 09:56:57 -------------------------------------------------------------------------------- Progress Note Details Patient Name: Date of Service: Renee [name] RBA [name]. 08/03/2022 8:15 [name] Medical Record Number: [id number] Patient Account Number: [id number] Date of Birth/Sex: Treating RN: 01-20-1996 (27 y.o. F) Primary Care Provider: [name] NT Other Clinician: Referring Provider: Treating Provider/Extender: [name], [name] NT Weeks in Treatment: 1 Subjective Chief Complaint Information obtained from Patient 07/26/2022; left dorsal foot wound History of Present Illness (HPI) 07/26/2022 [name] is [name] 27 year old female overall healthy individual that presents to the clinic for [name] trauma wound to her left dorsal foot. She states that she dropped  glassware on her foot creating an open wound. She visited urgent care for this issue. She had sutures placed. She was initially started on Augmentin however could not tolerate this and was switched to Keflex. On [name] subsequent wound care visit to urgent care she had an x-ray that showed [name] retained object. She eventually had sutures removed and had 3 derma clips placed. These were removed yesterday in urgent care. She has been using mupirocin ointment to the wound bed. She currently denies signs of infection. She is using crutches to help ambulate and work remove pressure from the foot. 2/27; patient presents for follow-up. She finished her course of Keflex. She has been using Vashe wet-to-dry dressings. She currently denies systemic signs of infection. Patient History Family History Cancer - Maternal Grandparents, Diabetes - Maternal Grandparents, Heart Disease - Maternal Grandparents, Hypertension - Mother, Stroke - Paternal Grandparents, No family history of Hereditary Spherocytosis, Kidney Disease, Lung Disease, Seizures, Thyroid Problems, Tuberculosis. Social History Never smoker, Marital Status - Married, Alcohol Use - Rarely, Drug Use - No History, Caffeine Use - Daily. Medical History Eyes Denies history of Cataracts, Glaucoma, Optic Neuritis Ear/Nose/Mouth/Throat Denies history of Chronic sinus problems/congestion, Middle ear problems Respiratory Denies history of Aspiration, Asthma, Chronic Obstructive Pulmonary Disease (COPD), Pneumothorax, Sleep Apnea, Tuberculosis Cardiovascular Denies history of Angina, Arrhythmia, Congestive Heart Failure, Coronary Artery Disease, Deep Vein Thrombosis, Hypertension, Hypotension, Myocardial Infarction, Peripheral Arterial Disease, Peripheral Venous Disease, Phlebitis, Vasculitis Gastrointestinal Denies history of Cirrhosis , Colitis, Crohnoos, Hepatitis [name], Hepatitis B, Hepatitis C Endocrine Denies history of Type I  Diabetes Genitourinary Denies history of End Stage Renal Disease Immunological Denies history of Lupus Erythematosus, Raynaudoos, Scleroderma Psychiatric Denies history of Anorexia/bulimia, Confinement Anxiety Medical [name] Surgical History Notes nd Ear/Nose/Mouth/Throat seasonal allergies ZAWADI, BAILIN [name] ([id number]) [id number] Page 5 of 8 Objective Constitutional respirations regular, non-labored and within target range for patient.. Vitals Time Taken: 8:22 AM, Height: 64 in, Weight: 170 lbs, BMI: 29.2, Temperature: 97.9 F, Pulse: 94 bpm, Respiratory Rate: 18 breaths/min, Blood Pressure: 146/87 mmHg. Cardiovascular 2+ dorsalis pedis/posterior tibialis pulses. Psychiatric pleasant and cooperative. General Notes: Left foot: T the dorsal aspect there is [name] laceration with undermining to the inferior portion. Granulation tissue present although does not appear o healthy. Mild tenderness to palpation. Integumentary (Hair, Skin) Wound #1 status is Open. Original cause of wound was Laceration. The date acquired was: 07/09/2022. The wound has been in treatment 1 weeks. The wound is located on the Left,Dorsal Foot. The wound measures 0.6cm length x 2.2cm width x 0.1cm depth; 1.037cm^2 area and 0.104cm^3 volume. There is Fat Layer (Subcutaneous Tissue) exposed. There is no tunneling or undermining noted. There is [name] medium amount of serosanguineous drainage noted.  There is small (1- 33%) red, pink granulation within the wound bed. There is [name] large (67-100%) amount of necrotic tissue within the wound bed including Eschar and Adherent Slough. The periwound skin appearance had no abnormalities noted for texture. Periwound temperature was noted as No Abnormality. The periwound has tenderness on palpation. Assessment Active Problems ICD-10 Unspecified open wound, left foot, initial encounter Other early complications of trauma, initial encounter Laceration with  foreign body, left foot, initial encounter Patient's wound is stable. I debrided nonviable tissue. She completed her course of Keflex however due to the darkened appearance of the tissue and continued tenderness she would benefit from continued antibiotics. I will give her doxycycline. At this time I recommended she clean the wound with Vashe but start using Medihoney and Hydrofera Blue is her main dressing. Follow-up in 1 week. Procedures Wound #1 Pre-procedure diagnosis of Wound #1 is [name] Trauma, Other located on the Left,Dorsal Foot . There was [name] Excisional Skin/Subcutaneous Tissue Debridement with [name] total area of 1.32 sq cm performed by Kalman Shan, DO. With the following instrument(s): Curette to remove Non-Viable tissue/material. Material removed includes Subcutaneous Tissue and Slough and after achieving pain control using Lidocaine 5% topical ointment. No specimens were taken. [name] time out was conducted at 08:40, prior to the start of the procedure. [name] Minimum amount of bleeding was controlled with Pressure. The procedure was tolerated well with [name] pain level of 0 throughout and [name] pain level of 0 following the procedure. Post Debridement Measurements: 0.6cm length x 2.2cm width x 0.1cm depth; 0.104cm^3 volume. Character of Wound/Ulcer Post Debridement is improved. Post procedure Diagnosis Wound #1: Same as Pre-Procedure General Notes: Scribed for Dr Heber Browns by Sharyn Creamer, RN. Plan Follow-up Appointments: Return Appointment in 1 week. - DR. Dayami Taitt Bathing/ Shower/ Hygiene: May shower and wash wound with soap and water. Edema Control - Lymphedema / SCD / Other: Segmental Compressive Device. Use the Segmental Compressive Device on leg(s) 2-3 times [name] day for 45 - 60 minutes. If wearing any wraps or hose, do not remove them. Continue exercising as instructed. Elevate legs to the level of the heart or above for 30 minutes daily and/or when sitting for 3-4 times [name] day throughout the  day. Avoid standing for long periods of time. The following medication(s) was prescribed: lidocaine topical 5 % ointment ointment topical once daily for prior to debridement was prescribed at facility doxycycline hyclate oral 100 mg tablet 1 1 tablet oral twice [name] day x 10 days starting 08/03/2022 NAMYA, MORINI [name] ([id number]) [id number] Page 6 of 8 WOUND #1: - Foot Wound Laterality: Dorsal, Left Cleanser: Vashe 5.8 (oz) 1 x Per Day/30 Days Discharge Instructions: Cleanse the wound with Vashe prior to applying [name] clean dressing using gauze sponges, not tissue or cotton balls. Prim Dressing: Hydrofera Blue Ready Transfer Foam, 2.5x2.5 (in/in) 1 x Per Day/30 Days ary Discharge Instructions: Apply directly to wound bed as directed Prim Dressing: MediHoney Calcium Alginate Dressing 4x5 in 1 x Per Day/30 Days ary Discharge Instructions: Apply to wound bed as instructed Secondary Dressing: ABD Pad, 8x10 (Generic) 1 x Per Day/30 Days Discharge Instructions: Apply over primary dressing as directed. Secured With: Elastic Bandage 4 inch (ACE bandage) (Generic) 1 x Per Day/30 Days Discharge Instructions: Secure with ACE bandage as directed. Secured With: [employer], 4.5x3.1 (in/yd) (Generic) 1 x Per Day/30 Days Discharge Instructions: Secure with Kerlix as directed. Secured With: Transpore Surgical T ape, 2x10 (in/yd) (Generic) 1 x Per Day/30 Days Discharge Instructions:  Secure dressing with tape as directed. 1. Doxycycline 2. In office sharp debridement 3. Hydrofera Blue and Medihoney 4. Continue Ace bandage 5. Follow-up in 1 week Electronic Signature(s) Signed: 08/03/2022 12:29:31 PM By: Kalman Shan DO Entered By: Kalman Shan on 08/03/2022 10:02:31 -------------------------------------------------------------------------------- HxROS Details Patient Name: Date of Service: Renee [name] RBA [name]. 08/03/2022 8:15 [name] Medical Record Number:  [id number] Patient Account Number: [id number] Date of Birth/Sex: Treating RN: 02-17-1996 (27 y.o. F) Primary Care Provider: [name] NT Other Clinician: Referring Provider: Treating Provider/Extender: [facility], [name] NT Weeks in Treatment: 1 Eyes Medical History: Negative for: Cataracts; Glaucoma; Optic Neuritis Ear/Nose/Mouth/Throat Medical History: Negative for: Chronic sinus problems/congestion; Middle ear problems Past Medical History Notes: seasonal allergies Respiratory Medical History: Negative for: Aspiration; Asthma; Chronic Obstructive Pulmonary Disease (COPD); Pneumothorax; Sleep Apnea; Tuberculosis Cardiovascular Medical History: Negative for: Angina; Arrhythmia; Congestive Heart Failure; Coronary Artery Disease; Deep Vein Thrombosis; Hypertension; Hypotension; Myocardial Infarction; Peripheral Arterial Disease; Peripheral Venous Disease; Phlebitis; Vasculitis Gastrointestinal Medical History: Negative for: Cirrhosis ; Colitis; Crohns; Hepatitis [name]; Hepatitis B; Hepatitis C Endocrine Medical History: Negative for: Type I Diabetes Genitourinary Medical History: Negative for: End Stage Renal Disease NYREE, WEICHMAN [name] ([id number]) [id number] Page 7 of 8 Immunological Medical History: Negative for: Lupus Erythematosus; Raynauds; Scleroderma Psychiatric Medical History: Negative for: Anorexia/bulimia; Confinement Anxiety Immunizations Pneumococcal Vaccine: Received Pneumococcal Vaccination: No Implantable Devices None Family and Social History Cancer: Yes - Maternal Grandparents; Diabetes: Yes - Maternal Grandparents; Heart Disease: Yes - Maternal Grandparents; Hereditary Spherocytosis: No; Hypertension: Yes - Mother; Kidney Disease: No; Lung Disease: No; Seizures: No; Stroke: Yes - Paternal Grandparents; Thyroid Problems: No; Tuberculosis: No; Never smoker; Marital Status - Married; Alcohol Use: Rarely; Drug Use: No  History; Caffeine Use: Daily; Financial Concerns: No; Food, Clothing or Shelter Needs: No; Support System Lacking: No; Transportation Concerns: No Electronic Signature(s) Signed: 08/03/2022 12:29:31 PM By: Kalman Shan DO Entered By: Kalman Shan on 08/03/2022 09:58:39 -------------------------------------------------------------------------------- [place] Details Patient Name: Date of Service: Renee [name] RBA [name]. 08/03/2022 Medical Record Number: [id number] Patient Account Number: [id number] Date of Birth/Sex: Treating RN: [birth year] (27 y.o. F) Primary Care Provider: [name] NT Other Clinician: Referring Provider: Treating Provider/Extender: [name], [name] NT Weeks in Treatment: 1 Diagnosis Coding ICD-10 Codes Code Description [id number] Unspecified open wound, left foot, initial encounter T79.8XXA Other early complications of trauma, initial encounter S91.322A Laceration with foreign body, left foot, initial encounter Facility Procedures : CPT4 Code: [id number] Description: 11042 - DEB SUBQ TISSUE 20 SQ CM/< ICD-10 Diagnosis Description S91.302A Unspecified open wound, left foot, initial encounter T79.8XXA Other early complications of trauma, initial encounter S91.322A Laceration with foreign body, left foot,  initial encounter Modifier: Quantity: 1 Physician Procedures : CPT4 Code Description Modifier [id number] - WC PHYS LEVEL 3 - EST PT 25 ICD-10 Diagnosis Description T79.8XXA Other early complications of trauma, initial encounter S91.302A Unspecified open wound, left foot, initial encounter S91.322A Laceration  with foreign body, left foot, initial encounter Quantity: 1 : [id number] - WC PHYS SUBQ TISS 20 SQ CM ICD-10 Diagnosis Description S91.302A Unspecified open wound, left foot, initial encounter T79.8XXA Other early complications of trauma, initial encounter S91.322A Laceration with foreign body, left foot,  initial encounter LILLYONNA, MAUTNER  [name] ([id number]) [id number] Quantity: 1 Page 8 of 8 Electronic Signature(s) Signed: 08/03/2022 12:29:31 PM By: Kalman Shan DO Entered By: Kalman Shan on 08/03/2022 10:02:58

## 2022-08-04 NOTE — Progress Notes (Signed)
Renee [name], Renee [name] ([id number]) [id number] Page 1 of 6 Visit Report for 08/03/2022 Arrival Information Details Patient Name: Date of Service: Renee [name], Renee [name] [facility] [name]. 08/03/2022 8:15 [name] Medical Record Number: [id number] Patient Account Number: [id number] Date of Birth/Sex: Treating RN: [birth year] (27 y.o. Renee [name] Primary Care Marrah Vanevery: [name] [name] Other Clinician: Referring Brae Schaafsma: Treating Ruqaya Strauss/Extender: [name], Renee [name] Weeks in Treatment: 1 Visit Information History Since Last Visit Added or deleted any medications: No Patient Arrived: Crutches Any new allergies or adverse reactions: No Arrival Time: 08:22 Had [name] fall or experienced change in No Accompanied By: friend activities of daily living that may affect Transfer Assistance: None risk of falls: Patient Identification Verified: Yes Signs or symptoms of abuse/neglect since last visito No Secondary Verification Process Completed: Yes Hospitalized since last visit: No Patient Requires Transmission-Based Precautions: No Implantable device outside of the clinic excluding No Patient Has Alerts: No cellular tissue based products placed in the center since last visit: Has Dressing in Place as Prescribed: Yes Has Compression in Place as Prescribed: Yes Pain Present Now: Yes Electronic Signature(s) Signed: 08/03/2022 4:02:57 PM By: Sharyn Creamer RN, BSN Entered By: Sharyn Creamer on 08/03/2022 08:23:13 -------------------------------------------------------------------------------- Encounter Discharge Information Details Patient Name: Date of Service: Renee [name]. 08/03/2022 8:15 [name] Medical Record Number: [id number] Patient Account Number: [id number] Date of Birth/Sex: Treating RN: September 22, 1995 (27 y.o. Renee [name] Primary Care Naija Troost: [name] [name] Other Clinician: Referring Lakendrick Paradis: Treating Cesario Weidinger/Extender: [name], Renee [name] Weeks in  Treatment: 1 Encounter Discharge Information Items Post Procedure Vitals Discharge Condition: Stable Temperature (F): 97.9 Ambulatory Status: Crutches Pulse (bpm): 94 Discharge Destination: Home Respiratory Rate (breaths/min): 18 Transportation: Private Auto Blood Pressure (mmHg): 146/87 Accompanied By: friend Schedule Follow-up Appointment: Yes Clinical Summary of Care: Patient Declined Electronic Signature(s) Signed: 08/03/2022 4:02:57 PM By: Sharyn Creamer RN, BSN Entered By: Sharyn Creamer on 08/03/2022 10:21:21 -------------------------------------------------------------------------------- Lower Extremity Assessment Details Patient Name: Date of Service: Renee [name]. 08/03/2022 8:15 [name] Medical Record Number: [id number] Patient Account Number: [id number] Date of Birth/Sex: Treating RN: [birth year] (27 y.o. Renee [name] Primary Care Blakely Gluth: [name] [name] Other Clinician: Referring Sharmel Ballantine: Treating Devony Mcgrady/Extender: Renee [facility], Renee [name] Weeks in Treatment: 1 Edema Assessment B[Left: SHELLA, MCKIBBEN [name] [id number] [Right: [id number] Page 2 of 6] Assessed: [Left: No] [Right: No] [Left: Edema] [Right: :] Calf Left: Right: Point of Measurement: From Medial Instep 38 cm Ankle Left: Right: Point of Measurement: From Medial Instep 22.5 cm Vascular Assessment Pulses: Dorsalis Pedis Palpable: [Left:Yes] Electronic Signature(s) Signed: 08/03/2022 4:02:57 PM By: Sharyn Creamer RN, BSN Entered By: Sharyn Creamer on 08/03/2022 [id number] -------------------------------------------------------------------------------- Multi Wound Chart Details Patient Name: Date of Service: Renee [name]. 08/03/2022 8:15 [name] Medical Record Number: [id number] Patient Account Number: [id number] Date of Birth/Sex: Treating RN: January 12, 1996 (27 y.o. F) Primary Care Jaikob Borgwardt: [name] [name] Other Clinician: Referring Erik Nessel: Treating  Malia Corsi/Extender: [name], Renee [name] Weeks in Treatment: 1 Vital Signs Height(in): 64 Pulse(bpm): 94 Weight(lbs): 170 Blood Pressure(mmHg): 146/87 Body Mass Index(BMI): 29.2 Temperature(F): 97.9 Respiratory Rate(breaths/min): 18 [1:Photos: No Photos Left, Dorsal Foot Wound Location: Laceration Wounding Event: Trauma, Other Primary Etiology: 07/09/2022 Date Acquired: 1 Weeks of Treatment: Open Wound Status: No Wound Recurrence: 0.6x2.2x0.1 Measurements L x W x D (cm) 1.037 [name] (cm) : rea 0.104  Volume (cm) : -10.10% % Reduction in [name] rea: -10.60% % Reduction in Volume: Full Thickness Without Exposed Classification: Support Structures  Medium Exudate [name] mount: Serosanguineous Exudate Type: red, brown Exudate Color: Small (1-33%) Granulation [name]  mount: Red, Pink Granulation Quality: Large (67-100%) Necrotic [name] mount: Eschar, Adherent Slough Necrotic Tissue: Fat Layer (Subcutaneous Tissue): Yes N/[name] Exposed Structures: Fascia: No Tendon: No Muscle: No Joint: No Bone: No Small (1-33%)  Epithelialization: Debridement - Excisional Debridement: Pre-procedure Verification/Time Out 08:40 Taken: Lidocaine 5% topical ointment Pain Control: Subcutaneous, Slough Tissue Debrided:] [N/[name]:N/[name] N/[name] N/[name] N/[name] N/[name] N/[name] N/[name] N/[name] N/[name] N/[name] N/[name] N/[name] N/[name] N/[name] N/[name]  N/[name] N/[name] N/[name] N/[name] N/[name] N/[name] N/[name] N/[name] N/[name] N/[name] N/[name]] Renee [name], Renee [name] ([id number]) [1:Skin/Subcutaneous Tissue Level: 1.32 Debridement [name] (sq cm): rea Curette Instrument: Minimum Bleeding: Pressure Hemostasis [name] chieved: 0 Procedural Pain: 0 Post Procedural Pain: Procedure was tolerated well Debridement Treatment Response: 0.6x2.2x0.1  Post Debridement Measurements L x W x D (cm) 0.104 Post Debridement Volume: (cm) No Abnormalities Noted Periwound Skin Texture: No Abnormality Temperature: Yes Tenderness on Palpation: Debridement Procedures Performed:] [N/[name]:N/[name] N/[name] N/[name] N/[name] N/[name] N/[name] N/[name]  N/[name] N/[name] N/[name] N/[name] N/[name] N/[name] N/[name]] Treatment Notes Electronic Signature(s) Signed:  08/03/2022 12:29:31 PM By: Renee [name] Entered By: Renee Shan on 08/03/2022 09:57:01 -------------------------------------------------------------------------------- Multi-Disciplinary Care Plan Details Patient Name: Date of Service: Renee [name]. 08/03/2022 8:15 [name] Medical Record Number: [id number] Patient Account Number: [id number] Date of Birth/Sex: Treating RN: December 17, 1995 (27 y.o. Renee [name] Primary Care Jyla Hopf: [name] [name] Other Clinician: Referring Ceceilia Cephus: Treating Cyncere Sontag/Extender: [name], Renee [name] Weeks in Treatment: 1 Active Inactive Nutrition Nursing Diagnoses: Potential for alteratiion in Nutrition/Potential for imbalanced nutrition Goals: Patient/caregiver agrees to and verbalizes understanding of need to use nutritional supplements and/or vitamins as prescribed Date Initiated: 07/26/2022 Target Resolution Date: 09/02/2022 Goal Status: Active Interventions: Provide education on nutrition Notes: Orientation to the Wound Care Program Nursing Diagnoses: Knowledge deficit related to the wound healing center program Goals: Patient/caregiver will verbalize understanding of the [place] Program Date Initiated: 07/26/2022 Target Resolution Date: 08/12/2022 Goal Status: Active Interventions: Provide education on orientation to the wound center Notes: Wound/Skin Impairment Nursing Diagnoses: Knowledge deficit related to ulceration/compromised skin integrity Goals: Ulcer/skin breakdown will have [name] volume reduction of 30% by week 4 Date Initiated: 07/26/2022 Target Resolution Date: 09/02/2022 Goal Status: Active Renee [name], Renee [name] ([id number]) [id number] Page 4 of 6 Interventions: Assess ulceration(s) every visit Provide education on ulcer and skin care Notes: Electronic Signature(s) Signed: 08/03/2022 4:02:57 PM By: Sharyn Creamer RN, BSN Entered By: Sharyn Creamer on 08/03/2022  08:32:35 -------------------------------------------------------------------------------- Pain Assessment Details Patient Name: Date of Service: Renee [name]. 08/03/2022 8:15 [name] Medical Record Number: [id number] Patient Account Number: [id number] Date of Birth/Sex: Treating RN: Nov 27, 1995 (27 y.o. Renee [name] Primary Care Jenean Escandon: [name] [name] Other Clinician: Referring Shantelle Alles: Treating Meily Glowacki/Extender: [name], Renee [name] Weeks in Treatment: 1 Active Problems Location of Pain Severity and Description of Pain Patient Has Paino Yes Site Locations Rate the pain. Current Pain Level: 2 Pain Management and Medication Current Pain Management: Electronic Signature(s) Signed: 08/03/2022 4:02:57 PM By: Sharyn Creamer RN, BSN Entered By: Sharyn Creamer on 08/03/2022 08:23:30 -------------------------------------------------------------------------------- Patient/Caregiver Education Details Patient Name: Date of Service: Renee [name]. 2/27/2024andnbsp8:15 [name] Medical Record Number: [id number] Patient Account Number: [id number] Date of Birth/Gender: Treating RN: 1995-07-29 (27 y.o. Renee [name] Primary Care Physician: [name] [name] Other Clinician: Referring Physician: Treating Physician/Extender: [name], Renee [name] Weeks in Treatment: 1 Education Assessment Education Provided To: Patient Education Topics  Provided Renee [name], Renee [name] ([id number]) [id number] Page 5 of 6 Wound/Skin Impairment: Methods: Explain/Verbal Responses: State content correctly Electronic Signature(s) Signed: 08/03/2022 4:02:57 PM By: Sharyn Creamer RN, BSN Entered By: Sharyn Creamer on 08/03/2022 08:32:59 -------------------------------------------------------------------------------- Wound Assessment Details Patient Name: Date of Service: Renee [name]. 08/03/2022 8:15 [name] Medical Record Number: [id number] Patient  Account Number: [id number] Date of Birth/Sex: Treating RN: 1995-06-23 (27 y.o. Renee [name] Primary Care Nohea Kras: [name] [name] Other Clinician: Referring Camelia Stelzner: Treating Damaso Laday/Extender: [name], Renee [name] Weeks in Treatment: 1 Wound Status Wound Number: 1 Primary Etiology: Trauma, Other Wound Location: Left, Dorsal Foot Wound Status: Open Wounding Event: Laceration Date Acquired: 07/09/2022 Weeks Of Treatment: 1 Clustered Wound: No Wound Measurements Length: (cm) 0.6 Width: (cm) 2.2 Depth: (cm) 0.1 Area: (cm) 1.037 Volume: (cm) 0.104 % Reduction in Area: -10.1% % Reduction in Volume: -10.6% Epithelialization: Small (1-33%) Tunneling: No Undermining: No Wound Description Classification: Full Thickness Without Exposed Support Structures Exudate Amount: Medium Exudate Type: Serosanguineous Exudate Color: red, brown Foul Odor After Cleansing: No Slough/Fibrino Yes Wound Bed Granulation Amount: Small (1-33%) Exposed Structure Granulation Quality: Red, Pink Fascia Exposed: No Necrotic Amount: Large (67-100%) Fat Layer (Subcutaneous Tissue) Exposed: Yes Necrotic Quality: Eschar, Adherent Slough Tendon Exposed: No Muscle Exposed: No Joint Exposed: No Bone Exposed: No Periwound Skin Texture Texture Color No Abnormalities Noted: Yes No Abnormalities Noted: No Moisture Temperature / Pain No Abnormalities Noted: No Temperature: No Abnormality Tenderness on Palpation: Yes Treatment Notes Wound #1 (Foot) Wound Laterality: Dorsal, Left Cleanser Vashe 5.8 (oz) Discharge Instruction: Cleanse the wound with Vashe prior to applying [name] clean dressing using gauze sponges, not tissue or cotton balls. Peri-Wound Care Topical Primary Dressing Hydrofera Blue Ready Transfer Foam, 2.5x2.5 (in/in) Discharge Instruction: Apply directly to wound bed as directed Renee [name], Renee [name] ([id number]) [id number] Page 6 of 6 MediHoney Calcium  Alginate Dressing 4x5 in Discharge Instruction: Apply to wound bed as instructed Secondary Dressing ABD Pad, 8x10 Discharge Instruction: Apply over primary dressing as directed. Secured With Elastic Bandage 4 inch (ACE bandage) Discharge Instruction: Secure with ACE bandage as directed. Kerlix Roll Sterile, 4.5x3.1 (in/yd) Discharge Instruction: Secure with Kerlix as directed. Transpore Surgical Tape, 2x10 (in/yd) Discharge Instruction: Secure dressing with tape as directed. Compression Wrap Compression Stockings Add-Ons Electronic Signature(s) Signed: 08/03/2022 4:02:57 PM By: Sharyn Creamer RN, BSN Entered By: Sharyn Creamer on 08/03/2022 08:31:24 -------------------------------------------------------------------------------- Vitals Details Patient Name: Date of Service: Renee [name]. 08/03/2022 8:15 [name] Medical Record Number: [id number] Patient Account Number: [id number] Date of Birth/Sex: Treating RN: 1996/03/15 (27 y.o. Renee [name] Primary Care Renee [name]: [name] [name] Other Clinician: Referring Mashelle Busick: Treating Aiman Noe/Extender: [name], Renee [name] Weeks in Treatment: 1 Vital Signs Time Taken: 08:22 Temperature (F): 97.9 Height (in): 64 Pulse (bpm): 94 Weight (lbs): 170 Respiratory Rate (breaths/min): 18 Body Mass Index (BMI): 29.2 Blood Pressure (mmHg): 146/87 Reference Range: 80 - 120 mg / dl Electronic Signature(s) Signed: 08/03/2022 4:02:57 PM By: Sharyn Creamer RN, BSN Entered By: Sharyn Creamer on 08/03/2022 08:22:30

## 2022-08-10 ENCOUNTER — Ambulatory Visit (HOSPITAL_BASED_OUTPATIENT_CLINIC_OR_DEPARTMENT_OTHER): Payer: Managed Care, Other (non HMO) | Admitting: Internal Medicine

## 2022-08-16 ENCOUNTER — Encounter (HOSPITAL_BASED_OUTPATIENT_CLINIC_OR_DEPARTMENT_OTHER): Payer: Managed Care, Other (non HMO) | Admitting: Internal Medicine

## 2022-08-19 ENCOUNTER — Encounter (HOSPITAL_BASED_OUTPATIENT_CLINIC_OR_DEPARTMENT_OTHER): Payer: Managed Care, Other (non HMO) | Attending: Internal Medicine | Admitting: Internal Medicine

## 2022-08-19 DIAGNOSIS — Z823 Family history of stroke: Secondary | ICD-10-CM | POA: Insufficient documentation

## 2022-08-19 DIAGNOSIS — Z833 Family history of diabetes mellitus: Secondary | ICD-10-CM | POA: Diagnosis not present

## 2022-08-19 DIAGNOSIS — S91302A Unspecified open wound, left foot, initial encounter: Secondary | ICD-10-CM

## 2022-08-19 DIAGNOSIS — Z8249 Family history of ischemic heart disease and other diseases of the circulatory system: Secondary | ICD-10-CM | POA: Diagnosis not present

## 2022-08-19 DIAGNOSIS — S91322A Laceration with foreign body, left foot, initial encounter: Secondary | ICD-10-CM | POA: Diagnosis not present

## 2022-08-20 NOTE — Progress Notes (Signed)
DUNG, DEWHIRST Renee ([id number]) [id number] Page 1 of 8 Visit Report for 08/19/2022 Chief Complaint Document Details Patient Name: Date of Service: Renee Renee, Renee Renee [facility] Renee Renee. 08/19/2022 9:00 Renee Renee Medical Record Number: [id number] Patient Account Number: [id number] Date of Birth/Sex: Treating RN: [birth year] (27 y.o. F) Primary Care Provider: Salvadore Renee [name] Other Clinician: Referring Provider: Treating Provider/Extender: Renee Renee, Renee Renee Weeks in Treatment: 3 Information Obtained from: Patient Chief Complaint 07/26/2022; left dorsal foot wound Electronic Signature(s) Signed: 08/19/2022 3:34:03 PM By: Renee Shan DO Entered By: Renee Renee on 08/19/2022 09:43:01 -------------------------------------------------------------------------------- Debridement Details Patient Name: Date of Service: Renee Renee. 08/19/2022 9:00 Renee Renee Medical Record Number: [id number] Patient Account Number: [id number] Date of Birth/Sex: Treating RN: 06-Mar-1996 (27 y.o. Renee Renee Primary Care Provider: Salvadore Renee [name] Other Clinician: Referring Provider: Treating Provider/Extender: Renee Renee, Renee Renee Weeks in Treatment: 3 Debridement Performed for Assessment: Wound #1 Left,Dorsal Foot Performed By: Physician Renee Shan, DO Debridement Type: Debridement Level of Consciousness (Pre-procedure): Awake and Alert Pre-procedure Verification/Time Out Yes - 09:20 Taken: Start Time: 09:22 Pain Control: Lidocaine 4% T opical Solution T Area Debrided (L x W): otal 0.5 (cm) x 1.6 (cm) = 0.8 (cm) Tissue and other material debrided: Non-Viable, Slough, Skin: Epidermis, Slough Level: Skin/Epidermis Debridement Description: Selective/Open Wound Instrument: Curette Bleeding: None Hemostasis Achieved: Pressure Procedural Pain: 4 Post Procedural Pain: 3 Response to Treatment: Procedure was tolerated well Level of Consciousness (Post- Awake and  Alert procedure): Post Debridement Measurements of Total Wound Length: (cm) 0.5 Width: (cm) 1.6 Depth: (cm) 0.1 Volume: (cm) 0.063 Character of Wound/Ulcer Post Debridement: Improved Post Procedure Diagnosis Same as Pre-procedure Notes Scribed for Dr Renee Renee by Renee Gouty, RN Electronic Signature(s) Signed: 08/19/2022 3:34:03 PM By: Renee Shan DO Signed: 08/19/2022 4:07:02 PM By: Renee Gouty RN, BSN Entered By: Renee Renee on 08/19/2022 09:23:27 Renee Renee ([id number]) [id number] Page 2 of 8 -------------------------------------------------------------------------------- HPI Details Patient Name: Date of Service: Renee Renee, Renee Renee. 08/19/2022 9:00 Renee Renee Medical Record Number: [id number] Patient Account Number: [id number] Date of Birth/Sex: Treating RN: May 23, 1996 (27 y.o. F) Primary Care Provider: Salvadore Renee [name] Other Clinician: Referring Provider: Treating Provider/Extender: Renee Renee, Renee Renee Weeks in Treatment: 3 History of Present Illness HPI Description: 07/26/2022 Renee Renee is Renee 27 year old female overall healthy individual that presents to the clinic for Renee trauma wound to her left dorsal foot. She states that she dropped glassware on her foot creating an open wound. She visited urgent care for this issue. She had sutures placed. She was initially started on Augmentin however could not tolerate this and was switched to Keflex. On Renee subsequent wound care visit to urgent care she had an x-ray that showed Renee retained object. She eventually had sutures removed and had 3 derma clips placed. These were removed yesterday in urgent care. She has been using mupirocin ointment to the wound bed. She currently denies signs of infection. She is using crutches to help ambulate and work remove pressure from the foot. 2/27; patient presents for follow-up. She finished her course of Keflex. She has been using Vashe  wet-to-dry dressings. She currently denies systemic signs of infection. 3/14; patient presents for follow-up. She has been using Medihoney and Hydrofera Blue to the wound bed. There is been improvement in wound healing. She has no issues or complaints today. She denies signs of infection. Electronic Signature(s) Signed: 08/19/2022 3:34:03 PM By: Renee Shan DO Entered By: Renee Renee on 08/19/2022  09:46:37 -------------------------------------------------------------------------------- Physical Exam Details Patient Name: Date of Service: Renee Renee, Renee Renee. 08/19/2022 9:00 Renee Renee Medical Record Number: [id number] Patient Account Number: [id number] Date of Birth/Sex: Treating RN: October 04, 1995 (27 y.o. F) Primary Care Provider: Salvadore Renee [name] Other Clinician: Referring Provider: Treating Provider/Extender: Renee Renee, Renee Renee Weeks in Treatment: 3 Constitutional respirations regular, non-labored and within target range for patient.. Cardiovascular 2+ dorsalis pedis/posterior tibialis pulses. Psychiatric pleasant and cooperative. Notes Left foot: T the dorsal aspect there is an open wound with granulation tissue and nonviable tissue. Minimal undermining to the inferior portion. Minimal o tenderness on palpation. No increased warmth, erythema or purulent. Electronic Signature(s) Signed: 08/19/2022 3:34:03 PM By: Renee Shan DO Entered By: Renee Renee on 08/19/2022 09:47:21 -------------------------------------------------------------------------------- Physician Orders Details Patient Name: Date of Service: Renee Renee. 08/19/2022 9:00 Renee Renee Medical Record Number: [id number] Patient Account Number: [id number] Date of Birth/Sex: Treating RN: 11-May-1996 (27 y.o. Renee Renee Primary Care Provider: Salvadore Renee [name] Other Clinician: Referring Provider: Treating Provider/Extender: Renee Renee, Renee Renee Weeks in Treatment: 3 Verbal / Phone  Orders: No Renee Renee, Renee Renee ([id number]) [id number] Page 3 of 8 Diagnosis Coding ICD-10 Coding Code Description [id number] Unspecified open wound, left foot, initial encounter T79.8XXA Other early complications of trauma, initial encounter S91.322A Laceration with foreign body, left foot, initial encounter Follow-up Appointments ppointment in 1 week. - DR. Scherrie Seneca Return Renee Anesthetic (In clinic) Topical Lidocaine 4% applied to wound bed Bathing/ Shower/ Hygiene May shower and wash wound with soap and water. Edema Control - Lymphedema / SCD / Other Left Lower Extremity Elevate legs to the level of the heart or above for 30 minutes daily and/or when sitting for 3-4 times Renee day throughout the day. Avoid standing for long periods of time. Wound Treatment Wound #1 - Foot Wound Laterality: Dorsal, Left Cleanser: Vashe 5.8 (oz) 1 x Per Day/30 Days Discharge Instructions: Cleanse the wound with Vashe prior to applying Renee clean dressing using gauze sponges, not tissue or cotton balls. Prim Dressing: Hydrofera Blue Ready Transfer Foam, 2.5x2.5 (in/in) 1 x Per Day/30 Days ary Discharge Instructions: Apply directly to wound bed as directed Prim Dressing: MediHoney Calcium Alginate Dressing 4x5 in 1 x Per Day/30 Days ary Discharge Instructions: Apply to wound bed as instructed Secondary Dressing: Woven Gauze Sponge, Non-Sterile 4x4 in 1 x Per Day/30 Days Discharge Instructions: Apply over primary dressing as directed. Secured With: Elastic Bandage 4 inch (ACE bandage) (Generic) 1 x Per Day/30 Days Discharge Instructions: Secure with ACE bandage as directed. Secured With: [employer], 4.5x3.1 (in/yd) (Generic) 1 x Per Day/30 Days Discharge Instructions: Secure with Kerlix as directed. Secured With: Transpore Surgical Tape, 2x10 (in/yd) (Generic) 1 x Per Day/30 Days Discharge Instructions: Secure dressing with tape as directed. Patient Medications llergies:  No Known Allergies Renee Notifications Medication Indication Start End prior to debridement 08/19/2022 lidocaine DOSE topical 4 % cream - cream topical Electronic Signature(s) Signed: 08/19/2022 3:34:03 PM By: Renee Shan DO Entered By: Renee Renee on 08/19/2022 09:47:33 -------------------------------------------------------------------------------- Problem List Details Patient Name: Date of Service: Renee Renee. 08/19/2022 9:00 Renee Renee Medical Record Number: [id number] Patient Account Number: [id number] Date of Birth/Sex: Treating RN: 16-Jul-1995 (27 y.o. Renee Renee Primary Care Provider: Salvadore Renee [name] Other Clinician: Referring Provider: Treating Provider/Extender: Renee Renee, Renee Renee Weeks in Treatment: 3 Active Problems Renee Renee, Renee Renee ([id number]) [id number] Page 4 of 8 ICD-10 Encounter Code Description Active Date MDM Diagnosis S91.302A  Unspecified open wound, left foot, initial encounter 07/26/2022 No Yes T79.8XXA Other early complications of trauma, initial encounter 07/26/2022 No Yes S91.322A Laceration with foreign body, left foot, initial encounter 07/26/2022 No Yes Inactive Problems Resolved Problems Electronic Signature(s) Signed: 08/19/2022 3:34:03 PM By: Renee Shan DO Entered By: Renee Renee on 08/19/2022 09:42:45 -------------------------------------------------------------------------------- Progress Note Details Patient Name: Date of Service: Renee Renee. 08/19/2022 9:00 Renee Renee Medical Record Number: [id number] Patient Account Number: [id number] Date of Birth/Sex: Treating RN: 03-02-1996 (27 y.o. F) Primary Care Provider: Salvadore Renee [name] Other Clinician: Referring Provider: Treating Provider/Extender: Renee Renee, Renee Renee Weeks in Treatment: 3 Subjective Chief Complaint Information obtained from Patient 07/26/2022; left dorsal foot wound History of Present Illness  (HPI) 07/26/2022 Ms. Renee Renee is Renee 27 year old female overall healthy individual that presents to the clinic for Renee trauma wound to her left dorsal foot. She states that she dropped glassware on her foot creating an open wound. She visited urgent care for this issue. She had sutures placed. She was initially started on Augmentin however could not tolerate this and was switched to Keflex. On Renee subsequent wound care visit to urgent care she had an x-ray that showed Renee retained object. She eventually had sutures removed and had 3 derma clips placed. These were removed yesterday in urgent care. She has been using mupirocin ointment to the wound bed. She currently denies signs of infection. She is using crutches to help ambulate and work remove pressure from the foot. 2/27; patient presents for follow-up. She finished her course of Keflex. She has been using Vashe wet-to-dry dressings. She currently denies systemic signs of infection. 3/14; patient presents for follow-up. She has been using Medihoney and Hydrofera Blue to the wound bed. There is been improvement in wound healing. She has no issues or complaints today. She denies signs of infection. Patient History Family History Cancer - Maternal Grandparents, Diabetes - Maternal Grandparents, Heart Disease - Maternal Grandparents, Hypertension - Mother, Stroke - Paternal Grandparents, No family history of Hereditary Spherocytosis, Kidney Disease, Lung Disease, Seizures, Thyroid Problems, Tuberculosis. Social History Never smoker, Marital Status - Married, Alcohol Use - Rarely, Drug Use - No History, Caffeine Use - Daily. Medical History Eyes Denies history of Cataracts, Glaucoma, Optic Neuritis Ear/Nose/Mouth/Throat Denies history of Chronic sinus problems/congestion, Middle ear problems Respiratory Denies history of Aspiration, Asthma, Chronic Obstructive Pulmonary Disease (COPD), Pneumothorax, Sleep Apnea,  Tuberculosis Cardiovascular Denies history of Angina, Arrhythmia, Congestive Heart Failure, Coronary Artery Disease, Deep Vein Thrombosis, Hypertension, Hypotension, Myocardial Infarction, Peripheral Arterial Disease, Peripheral Venous Disease, Phlebitis, Vasculitis Gastrointestinal Denies history of Cirrhosis , Colitis, Crohnoos, Hepatitis Renee, Hepatitis B, Hepatitis C Endocrine Denies history of Type I Diabetes Genitourinary Renee Renee, Renee Renee ([id number]) [id number] Page 5 of 8 Denies history of End Stage Renal Disease Immunological Denies history of Lupus Erythematosus, Raynaudoos, Scleroderma Psychiatric Denies history of Anorexia/bulimia, Confinement Anxiety Medical Renee Surgical History Notes nd Ear/Nose/Mouth/Throat seasonal allergies Objective Constitutional respirations regular, non-labored and within target range for patient.. Vitals Time Taken: 9:00 AM, Height: 64 in, Weight: 170 lbs, BMI: 29.2, Temperature: 97.7 F, Pulse: 75 bpm, Respiratory Rate: 18 breaths/min, Blood Pressure: 131/83 mmHg. Cardiovascular 2+ dorsalis pedis/posterior tibialis pulses. Psychiatric pleasant and cooperative. General Notes: Left foot: T the dorsal aspect there is an open wound with granulation tissue and nonviable tissue. Minimal undermining to the inferior portion. o Minimal tenderness on palpation. No increased warmth, erythema or purulent. Integumentary (Hair, Skin) Wound #1 status is Open. Original cause of wound  was Laceration. The date acquired was: 07/09/2022. The wound has been in treatment 3 weeks. The wound is located on the Left,Dorsal Foot. The wound measures 0.5cm length x 1.6cm width x 0.1cm depth; 0.628cm^2 area and 0.063cm^3 volume. There is Fat Layer (Subcutaneous Tissue) exposed. There is no tunneling or undermining noted. There is Renee medium amount of serosanguineous drainage noted. The wound margin is distinct with the outline attached to the wound  base. There is small (1-33%) red, pink granulation within the wound bed. There is Renee large (67-100%) amount of necrotic tissue within the wound bed including Eschar and Adherent Slough. The periwound skin appearance had no abnormalities noted for texture. The periwound skin appearance had no abnormalities noted for color. The periwound skin appearance did not exhibit: Dry/Scaly, Maceration. Periwound temperature was noted as No Abnormality. The periwound has tenderness on palpation. Assessment Active Problems ICD-10 Unspecified open wound, left foot, initial encounter Other early complications of trauma, initial encounter Laceration with foreign body, left foot, initial encounter Patient's wound has shown improvement in size and appearance since last clinic visit. I debrided nonviable tissue. I recommended continue the course with Medihoney and Hydrofera Blue. She is using an Ace wrap to help with compression and support. Follow-up in 2 weeks. Procedures Wound #1 Pre-procedure diagnosis of Wound #1 is Renee Trauma, Other located on the Left,Dorsal Foot . There was Renee Selective/Open Wound Skin/Epidermis Debridement with Renee total area of 0.8 sq cm performed by Renee Shan, DO. With the following instrument(s): Curette to remove Non-Viable tissue/material. Material removed includes Slough and Skin: Epidermis and after achieving pain control using Lidocaine 4% T opical Solution. No specimens were taken. Renee time out was conducted at 09:20, prior to the start of the procedure. There was no bleeding. The procedure was tolerated well with Renee pain level of 4 throughout and Renee pain level of 3 following the procedure. Post Debridement Measurements: 0.5cm length x 1.6cm width x 0.1cm depth; 0.063cm^3 volume. Character of Wound/Ulcer Post Debridement is improved. Post procedure Diagnosis Wound #1: Same as Pre-Procedure General Notes: Scribed for Dr Renee Tracy by Renee Gouty, RN. Plan Renee Renee, Renee Renee  ([id number]) [id number] Page 6 of 8 Follow-up Appointments: Return Appointment in 1 week. - DR. Xion Debruyne Anesthetic: (In clinic) Topical Lidocaine 4% applied to wound bed Bathing/ Shower/ Hygiene: May shower and wash wound with soap and water. Edema Control - Lymphedema / SCD / Other: Elevate legs to the level of the heart or above for 30 minutes daily and/or when sitting for 3-4 times Renee day throughout the day. Avoid standing for long periods of time. The following medication(s) was prescribed: lidocaine topical 4 % cream cream topical for prior to debridement was prescribed at facility WOUND #1: - Foot Wound Laterality: Dorsal, Left Cleanser: Vashe 5.8 (oz) 1 x Per Day/30 Days Discharge Instructions: Cleanse the wound with Vashe prior to applying Renee clean dressing using gauze sponges, not tissue or cotton balls. Prim Dressing: Hydrofera Blue Ready Transfer Foam, 2.5x2.5 (in/in) 1 x Per Day/30 Days ary Discharge Instructions: Apply directly to wound bed as directed Prim Dressing: MediHoney Calcium Alginate Dressing 4x5 in 1 x Per Day/30 Days ary Discharge Instructions: Apply to wound bed as instructed Secondary Dressing: Woven Gauze Sponge, Non-Sterile 4x4 in 1 x Per Day/30 Days Discharge Instructions: Apply over primary dressing as directed. Secured With: Elastic Bandage 4 inch (ACE bandage) (Generic) 1 x Per Day/30 Days Discharge Instructions: Secure with ACE bandage as directed. Secured With: [employer], 4.5x3.1 (in/yd) (Generic)  1 x Per Day/30 Days Discharge Instructions: Secure with Kerlix as directed. Secured With: Transpore Surgical T ape, 2x10 (in/yd) (Generic) 1 x Per Day/30 Days Discharge Instructions: Secure dressing with tape as directed. 1. In office sharp debridement 2. Medihoney and Hydrofera Blue 3. Follow-up in 2 weeks Electronic Signature(s) Signed: 08/19/2022 3:34:03 PM By: Renee Shan DO Entered By: Renee Renee on  08/19/2022 09:48:18 -------------------------------------------------------------------------------- HxROS Details Patient Name: Date of Service: Renee Renee. 08/19/2022 9:00 Renee Renee Medical Record Number: [id number] Patient Account Number: [id number] Date of Birth/Sex: Treating RN: May 26, 1996 (27 y.o. F) Primary Care Provider: Salvadore Renee [name] Other Clinician: Referring Provider: Treating Provider/Extender: [facility], Renee Renee Weeks in Treatment: 3 Eyes Medical History: Negative for: Cataracts; Glaucoma; Optic Neuritis Ear/Nose/Mouth/Throat Medical History: Negative for: Chronic sinus problems/congestion; Middle ear problems Past Medical History Notes: seasonal allergies Respiratory Medical History: Negative for: Aspiration; Asthma; Chronic Obstructive Pulmonary Disease (COPD); Pneumothorax; Sleep Apnea; Tuberculosis Cardiovascular Medical History: Negative for: Angina; Arrhythmia; Congestive Heart Failure; Coronary Artery Disease; Deep Vein Thrombosis; Hypertension; Hypotension; Myocardial Infarction; Peripheral Arterial Disease; Peripheral Venous Disease; Phlebitis; Vasculitis Gastrointestinal Medical History: Negative for: Cirrhosis ; Colitis; Crohns; Hepatitis Renee; Hepatitis B; Hepatitis C Endocrine Medical HistoryKIMORE, BLASH ([id number]) [id number] Page 7 of 8 Negative for: Type I Diabetes Genitourinary Medical History: Negative for: End Stage Renal Disease Immunological Medical History: Negative for: Lupus Erythematosus; Raynauds; Scleroderma Psychiatric Medical History: Negative for: Anorexia/bulimia; Confinement Anxiety Immunizations Pneumococcal Vaccine: Received Pneumococcal Vaccination: No Implantable Devices None Family and Social History Cancer: Yes - Maternal Grandparents; Diabetes: Yes - Maternal Grandparents; Heart Disease: Yes - Maternal Grandparents; Hereditary Spherocytosis: No; Hypertension: Yes -  Mother; Kidney Disease: No; Lung Disease: No; Seizures: No; Stroke: Yes - Paternal Grandparents; Thyroid Problems: No; Tuberculosis: No; Never smoker; Marital Status - Married; Alcohol Use: Rarely; Drug Use: No History; Caffeine Use: Daily; Financial Concerns: No; Food, Clothing or Shelter Needs: No; Support System Lacking: No; Transportation Concerns: No Electronic Signature(s) Signed: 08/19/2022 3:34:03 PM By: Renee Shan DO Entered By: Renee Renee on 08/19/2022 09:46:42 -------------------------------------------------------------------------------- SuperBill Details Patient Name: Date of Service: Renee Renee. 08/19/2022 Medical Record Number: [id number] Patient Account Number: [id number] Date of Birth/Sex: Treating RN: 1996-01-23 (27 y.o. Renee Renee Primary Care Provider: Salvadore Renee [name] Other Clinician: Referring Provider: Treating Provider/Extender: Renee Renee, Renee Renee Weeks in Treatment: 3 Diagnosis Coding ICD-10 Codes Code Description [phone] Unspecified open wound, left foot, initial encounter T79.8XXA Other early complications of trauma, initial encounter S91.322A Laceration with foreign body, left foot, initial encounter Facility Procedures : CPT4 Code: [id number] Description: [phone] - DEBRIDE WOUND 1ST 20 SQ CM OR < ICD-10 Diagnosis Description S91.322A Laceration with foreign body, left foot, initial encounter S91.302A Unspecified open wound, left foot, initial encounter Modifier: Quantity: 1 Physician Procedures Electronic Signature(s) Signed: 08/19/2022 3:34:03 PM By: Renee Shan DO Entered By: Renee Renee on 08/19/2022 09:48:28

## 2022-08-20 NOTE — Progress Notes (Signed)
Renee [name], Renee [name] ([id number]) [id number] Page 1 of 7 Visit Report for 08/19/2022 Arrival Information Details Patient Name: Date of Service: Renee [name], Renee [name] [facility] [name]. 08/19/2022 9:00 [name] Medical Record Number: [id number] Patient Account Number: [id number] Date of Birth/Sex: Treating RN: [birth year] (27 y.o. Martyn Malay, Linda Primary Care Timur Nibert: [name] NT Other Clinician: Referring Illana Nolting: Treating Kaleeyah Cuffie/Extender: [name], [name] NT Weeks in Treatment: 3 Visit Information History Since Last Visit Added or deleted any medications: No Patient Arrived: Crutches Any new allergies or adverse reactions: No Arrival Time: 08:59 Had [name] fall or experienced change in No Accompanied By: spouse activities of daily living that may affect Transfer Assistance: None risk of falls: Patient Identification Verified: Yes Signs or symptoms of abuse/neglect since last visito No Secondary Verification Process Completed: Yes Hospitalized since last visit: No Patient Requires Transmission-Based Precautions: No Implantable device outside of the clinic excluding No Patient Has Alerts: No cellular tissue based products placed in the center since last visit: Has Dressing in Place as Prescribed: Yes Pain Present Now: Yes Electronic Signature(s) Signed: 08/19/2022 4:07:02 PM By: Baruch Gouty RN, BSN Entered By: Baruch Gouty on 08/19/2022 08:59:49 -------------------------------------------------------------------------------- Encounter Discharge Information Details Patient Name: Date of Service: Renee Bump RBA [name]. 08/19/2022 9:00 [name] Medical Record Number: [id number] Patient Account Number: [id number] Date of Birth/Sex: Treating RN: 28-Sep-1995 (27 y.o. Elam Dutch Primary Care Dorisann Schwanke: [name] NT Other Clinician: Referring Marv Alfrey: Treating Ronit Marczak/Extender: [name], [name] NT Weeks in Treatment: 3 Encounter Discharge  Information Items Post Procedure Vitals Discharge Condition: Stable Temperature (F): 97.7 Ambulatory Status: Crutches Pulse (bpm): 75 Discharge Destination: Home Respiratory Rate (breaths/min): 18 Transportation: Private Auto Blood Pressure (mmHg): 131/83 Accompanied By: spouse Schedule Follow-up Appointment: Yes Clinical Summary of Care: Patient Declined Electronic Signature(s) Signed: 08/19/2022 4:07:02 PM By: Baruch Gouty RN, BSN Entered By: Baruch Gouty on 08/19/2022 09:35:04 -------------------------------------------------------------------------------- Lower Extremity Assessment Details Patient Name: Date of Service: Renee [name] [name]. 08/19/2022 9:00 [name] Medical Record Number: [id number] Patient Account Number: [id number] Date of Birth/Sex: Treating RN: [birth year] (27 y.o. Elam Dutch Primary Care Ammy Lienhard: [name] NT Other Clinician: Referring Ahliya Glatt: Treating Wylene Weissman/Extender: [name], [name] NT Weeks in Treatment: 3 Edema Assessment Assessed: [Left: No] [Right: No] B[Left: Renee [name], Renee [name] [[id number] Page 2 of 7] Edema: [Left: N] [Right: o] Calf Left: Right: Point of Measurement: From Medial Instep 38 cm Ankle Left: Right: Point of Measurement: From Medial Instep 22.5 cm Vascular Assessment Pulses: Dorsalis Pedis Palpable: [Left:Yes] Electronic Signature(s) Signed: 08/19/2022 4:07:02 PM By: Baruch Gouty RN, BSN Entered By: Baruch Gouty on 08/19/2022 09:03:52 -------------------------------------------------------------------------------- Multi Wound Chart Details Patient Name: Date of Service: Renee Bump RBA [name]. 08/19/2022 9:00 [name] Medical Record Number: [id number] Patient Account Number: [id number] Date of Birth/Sex: Treating RN: 23-Aug-1995 (27 y.o. F) Primary Care Zaide Mcclenahan: [name] NT Other Clinician: Referring Josephmichael Lisenbee: Treating Cozy Veale/Extender: [name], [name] NT Weeks in Treatment: 3 Vital Signs Height(in): 64 Pulse(bpm): 75 Weight(lbs): 170 Blood Pressure(mmHg): 131/83 Body Mass Index(BMI): 29.2 Temperature(F): 97.7 Respiratory Rate(breaths/min): 18 [1:Photos:] [N/[name]:N/[name]] Left, Dorsal Foot N/[name] N/[name] Wound Location: Laceration N/[name] N/[name] Wounding Event: Trauma, Other N/[name] N/[name] Primary Etiology: 07/09/2022 N/[name] N/[name] Date Acquired: 3 N/[name] N/[name] Weeks of Treatment: Open N/[name] N/[name] Wound Status: No N/[name] N/[name] Wound Recurrence: 0.5x1.6x0.1 N/[name] N/[name] Measurements L x W x D (cm) 0.628 N/[name] N/[name] [name] (cm) : rea 0.063 N/[name] N/[name] Volume (cm) : 33.30% N/[name] N/[name] % Reduction in  [name] rea: 33.00% N/[name] N/[name] % Reduction in Volume: Full Thickness Without Exposed N/[name] N/[name] Classification: Support Structures Medium N/[name] N/[name] Exudate [name] mount: Serosanguineous N/[name] N/[name] Exudate Type: red, brown N/[name] N/[name] Exudate Color: Distinct, outline attached N/[name] N/[name] Wound Margin: Small (1-33%) N/[name] N/[name] Granulation Amount: Red, Pink N/[name] N/[name] Granulation Quality: Large (67-100%) N/[name] N/[name] Necrotic Amount: Eschar, Adherent Slough N/[name] N/[name] Necrotic Tissue: Fat Layer (Subcutaneous Tissue): Yes N/[name] N/[name] Exposed Structures: Fascia: No Tendon: No Renee [name], Renee [name] ([id number]) [phone].pdf Page 3 of 7 Muscle: No Joint: No Bone: No Small (1-33%) N/[name] N/[name] Epithelialization: Debridement - Selective/Open Wound N/[name] N/[name] Debridement: Pre-procedure Verification/Time Out 09:20 N/[name] N/[name] Taken: Lidocaine 4% Topical Solution N/[name] N/[name] Pain Control: Slough N/[name] N/[name] Tissue Debrided: Skin/Epidermis N/[name] N/[name] Level: 0.8 N/[name] N/[name] Debridement [name] (sq cm): rea Curette N/[name] N/[name] Instrument: None N/[name] N/[name] Bleeding: Pressure N/[name] N/[name] Hemostasis [name] chieved: 4 N/[name] N/[name] Procedural Pain: 3 N/[name] N/[name] Post Procedural Pain: Procedure was tolerated well N/[name] N/[name] Debridement Treatment Response: 0.5x1.6x0.1 N/[name] N/[name] Post Debridement Measurements L x W x D (cm) 0.063 N/[name]  N/[name] Post Debridement Volume: (cm) No Abnormalities Noted N/[name] N/[name] Periwound Skin Texture: Maceration: No N/[name] N/[name] Periwound Skin Moisture: Dry/Scaly: No No Abnormalities Noted N/[name] N/[name] Periwound Skin Color: No Abnormality N/[name] N/[name] Temperature: Yes N/[name] N/[name] Tenderness on Palpation: Debridement N/[name] N/[name] Procedures Performed: Treatment Notes Wound #1 (Foot) Wound Laterality: Dorsal, Left Cleanser Vashe 5.8 (oz) Discharge Instruction: Cleanse the wound with Vashe prior to applying [name] clean dressing using gauze sponges, not tissue or cotton balls. Peri-Wound Care Topical Primary Dressing Hydrofera Blue Ready Transfer Foam, 2.5x2.5 (in/in) Discharge Instruction: Apply directly to wound bed as directed MediHoney Calcium Alginate Dressing 4x5 in Discharge Instruction: Apply to wound bed as instructed Secondary Dressing Woven Gauze Sponge, Non-Sterile 4x4 in Discharge Instruction: Apply over primary dressing as directed. Secured With Elastic Bandage 4 inch (ACE bandage) Discharge Instruction: Secure with ACE bandage as directed. Kerlix Roll Sterile, 4.5x3.1 (in/yd) Discharge Instruction: Secure with Kerlix as directed. Transpore Surgical Tape, 2x10 (in/yd) Discharge Instruction: Secure dressing with tape as directed. Compression Wrap Compression Stockings Add-Ons Electronic Signature(s) Signed: 08/19/2022 3:34:03 PM By: Kalman Shan DO Entered By: Kalman Shan on 08/19/2022 09:42:55 -------------------------------------------------------------------------------- Multi-Disciplinary Care Plan Details Patient Name: Date of Service: Renee Bump RBA [name]. 08/19/2022 9:00 [name] Medical Record Number: [id number] Patient Account Number: [id number] Date of Birth/Sex: Treating RN: 05-May-1996 (27 y.o. [address] [place], [place] [name] ([id number]) [id number] Page 4 of 7 Primary Care Viana Sleep: [name] NT Other Clinician: Referring Naketa Daddario: Treating  Alexius Ellington/Extender: [name], [name] NT Weeks in Treatment: 3 Multidisciplinary Care Plan reviewed with physician Active Inactive Nutrition Nursing Diagnoses: Potential for alteratiion in Nutrition/Potential for imbalanced nutrition Goals: Patient/caregiver agrees to and verbalizes understanding of need to use nutritional supplements and/or vitamins as prescribed Date Initiated: 07/26/2022 Target Resolution Date: 09/02/2022 Goal Status: Active Interventions: Provide education on nutrition Notes: Wound/Skin Impairment Nursing Diagnoses: Knowledge deficit related to ulceration/compromised skin integrity Goals: Patient/caregiver will verbalize understanding of skin care regimen Date Initiated: 08/19/2022 Target Resolution Date: 09/02/2022 Goal Status: Active Ulcer/skin breakdown will have [name] volume reduction of 30% by week 4 Date Initiated: 07/26/2022 Target Resolution Date: 09/02/2022 Goal Status: Active Interventions: Assess ulceration(s) every visit Provide education on ulcer and skin care Notes: Electronic Signature(s) Signed: 08/19/2022 4:07:02 PM By: Baruch Gouty RN, BSN Entered By: Baruch Gouty on 08/19/2022 09:10:20 -------------------------------------------------------------------------------- Pain Assessment Details Patient Name: Date of Service: Renee [name], Renee RBA [name].  08/19/2022 9:00 [name] Medical Record Number: [id number] Patient Account Number: [id number] Date of Birth/Sex: Treating RN: March 18, 1996 (27 y.o. Elam Dutch Primary Care Codi Kertz: [name] NT Other Clinician: Referring Annissa Andreoni: Treating Azzam Mehra/Extender: [name], [name] NT Weeks in Treatment: 3 Active Problems Location of Pain Severity and Description of Pain Patient Has Paino Yes Site Locations Pain LocationIWALANI, Renee [name] ([id number]) [phone].pdf Page 5 of 7 Pain Location: Pain in Ulcers With Dressing Change: Yes Duration of the  Pain. Constant / Intermittento Intermittent Rate the pain. Current Pain Level: 1 Worst Pain Level: 2 Least Pain Level: 0 Character of Pain Describe the Pain: Other: sore Pain Management and Medication Current Pain Management: Medication: Yes Is the Current Pain Management Adequate: Adequate How does your wound impact your activities of daily livingo Sleep: No Bathing: No Appetite: No Relationship With Others: No Bladder Continence: No Emotions: No Bowel Continence: No Work: No Toileting: No Drive: No Dressing: No Hobbies: No Electronic Signature(s) Signed: 08/19/2022 4:07:02 PM By: Baruch Gouty RN, BSN Entered By: Baruch Gouty on 08/19/2022 09:01:01 -------------------------------------------------------------------------------- Patient/Caregiver Education Details Patient Name: Date of Service: Renee [name] [name]. 3/14/2024andnbsp9:00 [name] Medical Record Number: [id number] Patient Account Number: [id number] Date of Birth/Gender: Treating RN: 02-07-1996 (27 y.o. Elam Dutch Primary Care Physician: [name] NT Other Clinician: Referring Physician: Treating Physician/Extender: [name], [name] NT Weeks in Treatment: 3 Education Assessment Education Provided To: Patient Education Topics Provided Wound/Skin Impairment: Methods: Explain/Verbal Responses: Reinforcements needed, State content correctly Electronic Signature(s) Signed: 08/19/2022 4:07:02 PM By: Baruch Gouty RN, BSN Entered By: Baruch Gouty on 08/19/2022 09:10:38 -------------------------------------------------------------------------------- Wound Assessment Details Patient Name: Date of Service: Renee Bump RBA [name]. 08/19/2022 9:00 [name] Medical Record Number: [id number] Patient Account Number: [id number] Renee [name], Renee [name] ([id number]) [phone].pdf Page 6 of 7 Date of Birth/Sex: Treating RN: 1995/12/06 (27 y.o. Elam Dutch Primary Care  Ziyon Cedotal: [name] NT Other Clinician: Referring Natlie Asfour: Treating Jazzmyne Rasnick/Extender: [name], [name] NT Weeks in Treatment: 3 Wound Status Wound Number: 1 Primary Etiology: Trauma, Other Wound Location: Left, Dorsal Foot Wound Status: Open Wounding Event: Laceration Date Acquired: 07/09/2022 Weeks Of Treatment: 3 Clustered Wound: No Photos Wound Measurements Length: (cm) 0.5 Width: (cm) 1.6 Depth: (cm) 0.1 Area: (cm) 0.628 Volume: (cm) 0.063 % Reduction in Area: 33.3% % Reduction in Volume: 33% Epithelialization: Small (1-33%) Tunneling: No Undermining: No Wound Description Classification: Full Thickness Without Exposed Support Structures Wound Margin: Distinct, outline attached Exudate Amount: Medium Exudate Type: Serosanguineous Exudate Color: red, brown Foul Odor After Cleansing: No Slough/Fibrino Yes Wound Bed Granulation Amount: Small (1-33%) Exposed Structure Granulation Quality: Red, Pink Fascia Exposed: No Necrotic Amount: Large (67-100%) Fat Layer (Subcutaneous Tissue) Exposed: Yes Necrotic Quality: Eschar, Adherent Slough Tendon Exposed: No Muscle Exposed: No Joint Exposed: No Bone Exposed: No Periwound Skin Texture Texture Color No Abnormalities Noted: Yes No Abnormalities Noted: Yes Moisture Temperature / Pain No Abnormalities Noted: No Temperature: No Abnormality Dry / Scaly: No Tenderness on Palpation: Yes Maceration: No Treatment Notes Wound #1 (Foot) Wound Laterality: Dorsal, Left Cleanser Vashe 5.8 (oz) Discharge Instruction: Cleanse the wound with Vashe prior to applying [name] clean dressing using gauze sponges, not tissue or cotton balls. Peri-Wound Care Topical Primary Dressing Hydrofera Blue Ready Transfer Foam, 2.5x2.5 (in/in) Discharge Instruction: Apply directly to wound bed as directed Renee [name], Renee [name] ([id number]) [id number] Page 7 of 7 MediHoney Calcium Alginate Dressing 4x5  in Discharge Instruction: Apply to wound bed as instructed Secondary Dressing Woven  Gauze Sponge, Non-Sterile 4x4 in Discharge Instruction: Apply over primary dressing as directed. Secured With Elastic Bandage 4 inch (ACE bandage) Discharge Instruction: Secure with ACE bandage as directed. Kerlix Roll Sterile, 4.5x3.1 (in/yd) Discharge Instruction: Secure with Kerlix as directed. Transpore Surgical Tape, 2x10 (in/yd) Discharge Instruction: Secure dressing with tape as directed. Compression Wrap Compression Stockings Add-Ons Electronic Signature(s) Signed: 08/19/2022 4:07:02 PM By: Baruch Gouty RN, BSN Entered By: Baruch Gouty on 08/19/2022 09:08:27 -------------------------------------------------------------------------------- Vitals Details Patient Name: Date of Service: Renee Bump RBA [name]. 08/19/2022 9:00 [name] Medical Record Number: [id number] Patient Account Number: [id number] Date of Birth/Sex: Treating RN: 1995/09/16 (27 y.o. Elam Dutch Primary Care Shonya Sumida: [name] NT Other Clinician: Referring Carmella Kees: Treating Epsie Walthall/Extender: [name], [name] NT Weeks in Treatment: 3 Vital Signs Time Taken: 09:00 Temperature (F): 97.7 Height (in): 64 Pulse (bpm): 75 Weight (lbs): 170 Respiratory Rate (breaths/min): 18 Body Mass Index (BMI): 29.2 Blood Pressure (mmHg): 131/83 Reference Range: 80 - 120 mg / dl Electronic Signature(s) Signed: 08/19/2022 4:07:02 PM By: Baruch Gouty RN, BSN Entered By: Baruch Gouty on 08/19/2022 09:00:22

## 2022-08-25 ENCOUNTER — Ambulatory Visit (HOSPITAL_BASED_OUTPATIENT_CLINIC_OR_DEPARTMENT_OTHER): Payer: Managed Care, Other (non HMO) | Admitting: Internal Medicine

## 2022-09-02 ENCOUNTER — Encounter (HOSPITAL_BASED_OUTPATIENT_CLINIC_OR_DEPARTMENT_OTHER): Payer: Managed Care, Other (non HMO) | Admitting: Internal Medicine

## 2022-09-02 DIAGNOSIS — T798XXA Other early complications of trauma, initial encounter: Secondary | ICD-10-CM

## 2022-09-02 DIAGNOSIS — S91302A Unspecified open wound, left foot, initial encounter: Secondary | ICD-10-CM | POA: Diagnosis not present

## 2022-09-02 DIAGNOSIS — S91322A Laceration with foreign body, left foot, initial encounter: Secondary | ICD-10-CM | POA: Diagnosis not present

## 2022-09-08 NOTE — Progress Notes (Signed)
KYRIE, BARNELL Brown ([id number]) [id number] Page 1 of 6 Visit Report for 09/02/2022 Chief Complaint Document Details Patient Name: Date of Service: Renee Brown, Brown [facility] Renee Brown Brown. 09/02/2022 11:00 [name] Medical Record Number: [id number] Patient Account Number: [id number] Date of Birth/Sex: Treating RN: 09-01-1995 (27 y.o. F) Primary Care Provider: Salvadore Brown NT Other Clinician: Referring Provider: Treating Provider/Extender: Renee Brown Brown, Renee Brown NT Weeks in Treatment: 5 Information Obtained from: Patient Chief Complaint 07/26/2022; left dorsal foot wound Electronic Signature(s) Signed: 09/02/2022 11:59:17 AM By: Renee Brown Shan DO Entered By: Renee Brown Brown on 09/02/2022 11:09:15 -------------------------------------------------------------------------------- HPI Details Patient Name: Date of Service: Renee Brown Renee Brown RBA Renee Brown Brown. 09/02/2022 11:00 [name] Medical Record Number: [id number] Patient Account Number: [id number] Date of Birth/Sex: Treating RN: 1996-03-05 (27 y.o. F) Primary Care Provider: Salvadore Brown NT Other Clinician: Referring Provider: Treating Provider/Extender: Renee Brown Brown, Renee Brown NT Weeks in Treatment: 5 History of Present Illness HPI Description: 07/26/2022 [name] Brown is Brown 27 year old female overall healthy individual that presents to the clinic for Brown trauma wound to her left dorsal foot. She states that she dropped glassware on her foot creating an open wound. She visited urgent care for this issue. She had sutures placed. She was initially started on Augmentin however could not tolerate this and was switched to Keflex. On Brown subsequent wound care visit to urgent care she had an x-ray that showed Brown retained object. She eventually had sutures removed and had 3 derma clips placed. These were removed yesterday in urgent care. She has been using mupirocin ointment to the wound bed. She currently denies signs of infection. She is using  crutches to help ambulate and work remove pressure from the foot. 2/27; patient presents for follow-up. She finished her course of Keflex. She has been using Vashe wet-to-dry dressings. She currently denies systemic signs of infection. 3/14; patient presents for follow-up. She has been using Medihoney and Hydrofera Blue to the wound bed. There is been improvement in wound healing. She has no issues or complaints today. She denies signs of infection. 3/28; patient presents for follow-up. She is been using Medihoney and Hydrofera Blue to the wound bed. She has Brown scab over the area. Area appears well- healed. Electronic Signature(s) Signed: 09/02/2022 11:59:17 AM By: Renee Brown Shan DO Entered By: Renee Brown Brown on 09/02/2022 11:09:40 -------------------------------------------------------------------------------- Physical Exam Details Patient Name: Date of Service: Renee Brown Renee Brown RBA Renee Brown Brown. 09/02/2022 11:00 [name] Medical Record Number: [id number] Patient Account Number: [id number] Date of Birth/Sex: Treating RN: 1995/11/10 (27 y.o. F) Primary Care Provider: Salvadore Brown NT Other Clinician: Referring Provider: Treating Provider/Extender: Renee Brown Brown, Renee Brown NT Weeks in Treatment: 5 Constitutional respirations regular, non-labored and within target range for patient.Marland Kitchen Renee Brown Brown, Renee Brown Brown ([id number]) [id number] Page 2 of 6 Cardiovascular 2+ dorsalis pedis/posterior tibialis pulses. Psychiatric pleasant and cooperative. Notes Left foot: T the dorsal aspect there is Brown scab to the previous wound site. Surrounding skin is intact with no increased warmth or erythema. No swelling noted. o No induration or tenderness on palpation. Electronic Signature(s) Signed: 09/02/2022 11:59:17 AM By: Renee Brown Shan DO Entered By: Renee Brown Brown on 09/02/2022 11:10:40 -------------------------------------------------------------------------------- Physician Orders  Details Patient Name: Date of Service: Renee Brown Renee Brown RBA Renee Brown Brown. 09/02/2022 11:00 [name] Medical Record Number: [id number] Patient Account Number: [id number] Date of Birth/Sex: Treating RN: March 13, 1996 (27 y.o. Benjaman Lobe Primary Care Provider: Salvadore Brown NT Other Clinician: Referring Provider: Treating Provider/Extender: Renee Brown Brown, Renee Brown NT Weeks in Treatment: 5 Verbal / Phone Orders: No Diagnosis Coding  Discharge From [facility] Services Discharge from [place] Non Wound Condition Protect area with: - mupirocin and bandaid until scab comes off Electronic Signature(s) Signed: 09/02/2022 11:59:17 AM By: Renee Brown Shan DO Entered By: Renee Brown Brown on 09/02/2022 11:10:46 -------------------------------------------------------------------------------- Problem List Details Patient Name: Date of Service: Renee Brown Renee Brown RBA Renee Brown Brown. 09/02/2022 11:00 [name] Medical Record Number: [id number] Patient Account Number: [id number] Date of Birth/Sex: Treating RN: 1995-12-22 (27 y.o. F) Primary Care Provider: Salvadore Brown NT Other Clinician: Referring Provider: Treating Provider/Extender: Renee Brown Brown, Renee Brown NT Weeks in Treatment: 5 Active Problems ICD-10 Encounter Code Description Active Date MDM Diagnosis S91.302A Unspecified open wound, left foot, initial encounter 07/26/2022 No Yes T79.8XXA Other early complications of trauma, initial encounter 07/26/2022 No Yes S91.322A Laceration with foreign body, left foot, initial encounter 07/26/2022 No Yes Inactive Problems Resolved Problems Renee Brown Brown, Renee Brown Brown ([id number]) [id number] Page 3 of 6 Electronic Signature(s) Signed: 09/02/2022 11:59:17 AM By: Renee Brown Shan DO Entered By: Renee Brown Brown on 09/02/2022 11:09:03 -------------------------------------------------------------------------------- Progress Note Details Patient Name: Date of Service: Renee Brown Renee Brown RBA Renee Brown Brown. 09/02/2022 11:00 Brown  M Medical Record Number: [id number] Patient Account Number: [id number] Date of Birth/Sex: Treating RN: [birth year] (27 y.o. F) Primary Care Provider: Salvadore Brown NT Other Clinician: Referring Provider: Treating Provider/Extender: Renee Brown Brown, Renee Brown NT Weeks in Treatment: 5 Subjective Chief Complaint Information obtained from Patient 07/26/2022; left dorsal foot wound History of Present Illness (HPI) 07/26/2022 [name] Brown is Brown 27 year old female overall healthy individual that presents to the clinic for Brown trauma wound to her left dorsal foot. She states that she dropped glassware on her foot creating an open wound. She visited urgent care for this issue. She had sutures placed. She was initially started on Augmentin however could not tolerate this and was switched to Keflex. On Brown subsequent wound care visit to urgent care she had an x-ray that showed Brown retained object. She eventually had sutures removed and had 3 derma clips placed. These were removed yesterday in urgent care. She has been using mupirocin ointment to the wound bed. She currently denies signs of infection. She is using crutches to help ambulate and work remove pressure from the foot. 2/27; patient presents for follow-up. She finished her course of Keflex. She has been using Vashe wet-to-dry dressings. She currently denies systemic signs of infection. 3/14; patient presents for follow-up. She has been using Medihoney and Hydrofera Blue to the wound bed. There is been improvement in wound healing. She has no issues or complaints today. She denies signs of infection. 3/28; patient presents for follow-up. She is been using Medihoney and Hydrofera Blue to the wound bed. She has Brown scab over the area. Area appears well- healed. Patient History Family History Cancer - Maternal Grandparents, Diabetes - Maternal Grandparents, Heart Disease - Maternal Grandparents, Hypertension - Mother, Stroke -  Paternal Grandparents, No family history of Hereditary Spherocytosis, Kidney Disease, Lung Disease, Seizures, Thyroid Problems, Tuberculosis. Social History Never smoker, Marital Status - Married, Alcohol Use - Rarely, Drug Use - No History, Caffeine Use - Daily. Medical History Eyes Denies history of Cataracts, Glaucoma, Optic Neuritis Ear/Nose/Mouth/Throat Denies history of Chronic sinus problems/congestion, Middle ear problems Respiratory Denies history of Aspiration, Asthma, Chronic Obstructive Pulmonary Disease (COPD), Pneumothorax, Sleep Apnea, Tuberculosis Cardiovascular Denies history of Angina, Arrhythmia, Congestive Heart Failure, Coronary Artery Disease, Deep Vein Thrombosis, Hypertension, Hypotension, Myocardial Infarction, Peripheral Arterial Disease, Peripheral Venous Disease, Phlebitis, Vasculitis Gastrointestinal Denies history of Cirrhosis , Colitis, Crohnoos, Hepatitis Brown, Hepatitis B, Hepatitis C  Endocrine Denies history of Type I Diabetes Genitourinary Denies history of End Stage Renal Disease Immunological Denies history of Lupus Erythematosus, Raynaudoos, Scleroderma Psychiatric Denies history of Anorexia/bulimia, Confinement Anxiety Medical Brown Surgical History Notes nd Ear/Nose/Mouth/Throat seasonal allergies Objective Renee Brown Brown, Renee Brown Brown ([id number]) [id number] Page 4 of 6 Constitutional respirations regular, non-labored and within target range for patient.. Vitals Time Taken: 10:55 AM, Height: 64 in, Weight: 170 lbs, BMI: 29.2, Temperature: 98.5 F, Pulse: 96 bpm, Respiratory Rate: 18 breaths/min, Blood Pressure: 135/86 mmHg. Cardiovascular 2+ dorsalis pedis/posterior tibialis pulses. Psychiatric pleasant and cooperative. General Notes: Left foot: T the dorsal aspect there is Brown scab to the previous wound site. Surrounding skin is intact with no increased warmth or erythema. No o swelling noted. No induration or tenderness  on palpation. Integumentary (Hair, Skin) Wound #1 status is Healed - Epithelialized. Original cause of wound was Laceration. The date acquired was: 07/09/2022. The wound has been in treatment 5 weeks. The wound is located on the Left,Dorsal Foot. The wound measures 0cm length x 0cm width x 0cm depth; 0cm^2 area and 0cm^3 volume. There is Fat Layer (Subcutaneous Tissue) exposed. There is no tunneling or undermining noted. There is Brown medium amount of serosanguineous drainage noted. The wound margin is distinct with the outline attached to the wound base. There is no granulation within the wound bed. There is Brown large (67-100%) amount of necrotic tissue within the wound bed including Eschar. The periwound skin appearance had no abnormalities noted for texture. The periwound skin appearance had no abnormalities noted for color. The periwound skin appearance did not exhibit: Dry/Scaly, Maceration. Periwound temperature was noted as No Abnormality. The periwound has tenderness on palpation. Assessment Active Problems ICD-10 Unspecified open wound, left foot, initial encounter Other early complications of trauma, initial encounter Laceration with foreign body, left foot, initial encounter Patient has done well with Medihoney and Hydrofera Blue. Area appears well-healed. She can place antibiotic ointment over the scab until this comes off. She knows to call with any questions or concerns. She may follow-up as needed. Plan Discharge From [facility] Services: Discharge from [place] Non Wound Condition: Protect area with: - mupirocin and bandaid until scab comes off 1. Discharge from clinic due to closed wound 2. Follow-up as needed Electronic Signature(s) Signed: 09/02/2022 11:59:17 AM By: Renee Brown Shan DO Entered By: Renee Brown Brown on 09/02/2022 11:11:30 -------------------------------------------------------------------------------- HxROS Details Patient Name: Date of Service: Renee Brown Renee Brown  RBA Renee Brown Brown. 09/02/2022 11:00 [name] Medical Record Number: [id number] Patient Account Number: [id number] Date of Birth/Sex: Treating RN: 1995/10/18 (27 y.o. F) Primary Care Provider: Salvadore Brown NT Other Clinician: Referring Provider: Treating Provider/Extender: [facility]e [facility], Renee Brown NT Weeks in Treatment: 5 Eyes Medical History: Negative for: Cataracts; Glaucoma; Optic Neuritis Renee Brown Brown, Renee Brown Brown ([id number]) [id number] Page 5 of 6 Ear/Nose/Mouth/Throat Medical History: Negative for: Chronic sinus problems/congestion; Middle ear problems Past Medical History Notes: seasonal allergies Respiratory Medical History: Negative for: Aspiration; Asthma; Chronic Obstructive Pulmonary Disease (COPD); Pneumothorax; Sleep Apnea; Tuberculosis Cardiovascular Medical History: Negative for: Angina; Arrhythmia; Congestive Heart Failure; Coronary Artery Disease; Deep Vein Thrombosis; Hypertension; Hypotension; Myocardial Infarction; Peripheral Arterial Disease; Peripheral Venous Disease; Phlebitis; Vasculitis Gastrointestinal Medical History: Negative for: Cirrhosis ; Colitis; Crohns; Hepatitis Brown; Hepatitis B; Hepatitis C Endocrine Medical History: Negative for: Type I Diabetes Genitourinary Medical History: Negative for: End Stage Renal Disease Immunological Medical History: Negative for: Lupus Erythematosus; Raynauds; Scleroderma Psychiatric Medical History: Negative for: Anorexia/bulimia; Confinement Anxiety Immunizations Pneumococcal Vaccine: Received Pneumococcal Vaccination: No Implantable Devices None Family  and Social History Cancer: Yes - Maternal Grandparents; Diabetes: Yes - Maternal Grandparents; Heart Disease: Yes - Maternal Grandparents; Hereditary Spherocytosis: No; Hypertension: Yes - Mother; Kidney Disease: No; Lung Disease: No; Seizures: No; Stroke: Yes - Paternal Grandparents; Thyroid Problems: No; Tuberculosis: No; Never smoker; Marital  Status - Married; Alcohol Use: Rarely; Drug Use: No History; Caffeine Use: Daily; Financial Concerns: No; Food, Clothing or Shelter Needs: No; Support System Lacking: No; Transportation Concerns: No Electronic Signature(s) Signed: 09/02/2022 11:59:17 AM By: Renee Brown Shan DO Entered By: Renee Brown Brown on 09/02/2022 11:09:47 -------------------------------------------------------------------------------- SuperBill Details Patient Name: Date of Service: Renee Brown Renee Brown RBA Renee Brown Brown. 09/02/2022 Medical Record Number: [id number] Patient Account Number: [id number] Date of Birth/Sex: Treating RN: 1996-06-05 (27 y.o. F) Primary Care Provider: Salvadore Brown NT Other Clinician: Referring Provider: Treating Provider/Extender: Renee Brown Brown, Renee Brown NT Weeks in Treatment: 5 Diagnosis Coding ICD-10 Codes Renee Brown Brown, Renee Brown Brown ([id number]) [id number] Page 6 of 6 Code Description [phone] Unspecified open wound, left foot, initial encounter T79.8XXA Other early complications of trauma, initial encounter S91.322A Laceration with foreign body, left foot, initial encounter Facility Procedures : CPT4 Code: [id number] Description: 99213 - WOUND CARE VISIT-LEV 3 EST PT Modifier: Quantity: 1 Physician Procedures : CPT4 Code Description Modifier [id number] - WC PHYS LEVEL 3 - EST PT ICD-10 Diagnosis Description S91.302A Unspecified open wound, left foot, initial encounter T79.8XXA Other early complications of trauma, initial encounter S91.322A Laceration with  foreign body, left foot, initial encounter Quantity: 1 Electronic Signature(s) Signed: 09/02/2022 11:59:17 AM By: Renee Brown Shan DO Signed: 09/08/2022 4:24:30 PM By: [name] Entered By: [name] on 09/02/2022 11:20:38

## 2022-09-08 NOTE — Progress Notes (Signed)
Renee Brown Brown ([id number]) [id number] Page 1 of 7 Visit Report for 09/02/2022 Arrival Information Details Patient Name: Date of Service: Renee Brown, Renee Brown [facility] Renee Brown. 09/02/2022 11:00 [name] Medical Record Number: [id number] Patient Account Number: [id number] Date of Birth/Sex: Treating RN: [birth year] (27 y.o. F) Primary Care Renee Brown: Renee Brown Renee Brown Other Clinician: Referring Renee Brown: Treating Renee Brown: Renee Brown, Renee Brown Renee Brown Weeks in Treatment: 5 Visit Information History Since Last Visit Added or deleted any medications: No Patient Arrived: Ambulatory Any new allergies or adverse reactions: No Arrival Time: 10:54 Had Brown fall or experienced change in No Accompanied By: self activities of daily living that may affect Transfer Assistance: None risk of falls: Patient Identification Verified: Yes Signs or symptoms of abuse/neglect since last visito No Secondary Verification Process Completed: Yes Hospitalized since last visit: No Patient Requires Transmission-Based Precautions: No Implantable device outside of the clinic excluding No Patient Has Alerts: No cellular tissue based products placed in the center since last visit: Has Dressing in Place as Prescribed: Yes Pain Present Now: No Electronic Signature(s) Signed: 09/02/2022 4:59:34 PM By: Renee Brown Entered By: Renee Brown 10:55:53 -------------------------------------------------------------------------------- Clinic Level of Care Assessment Details Patient Name: Date of Service: Renee Brown Renee Brown. 09/02/2022 11:00 [name] Medical Record Number: [id number] Patient Account Number: [id number] Date of Birth/Sex: Treating RN: 08-02-1995 (27 y.o. [name], Renee Brown Primary Care Renee Brown: Renee Brown Renee Brown Other Clinician: Referring Renee Brown: Treating Renee Brown: Renee Brown, Renee Brown Renee Brown Weeks in Treatment: 5 Clinic Level of Care Assessment Items TOOL 4  Quantity Score X- 1 0 Use when only an EandM is performed on FOLLOW-UP visit ASSESSMENTS - Nursing Assessment / Reassessment X- 1 10 Reassessment of Co-morbidities (includes updates in patient status) X- 1 5 Reassessment of Adherence to Treatment Plan ASSESSMENTS - Wound and Skin Brown ssessment / Reassessment X - Simple Wound Assessment / Reassessment - one wound 1 5 []  - 0 Complex Wound Assessment / Reassessment - multiple wounds []  - 0 Dermatologic / Skin Assessment (not related to wound area) ASSESSMENTS - Focused Assessment X- 1 5 Circumferential Edema Measurements - multi extremities []  - 0 Nutritional Assessment / Counseling / Intervention []  - 0 Lower Extremity Assessment (monofilament, tuning fork, pulses) []  - 0 Peripheral Arterial Disease Assessment (using hand held doppler) ASSESSMENTS - Ostomy and/or Continence Assessment and Care []  - 0 Incontinence Assessment and Management []  - 0 Ostomy Care Assessment and Management (repouching, etc.) PROCESS - Coordination of Care X - Simple Patient / Family Education for ongoing care 1 15 Renee Brown Brown ([id number]) [id number] Page 2 of 7 []  - 0 Complex (extensive) Patient / Family Education for ongoing care X- 1 10 Staff obtains Programmer, systems, Records, T Results / Process Orders est []  - 0 Staff telephones HHA, Nursing Homes / Clarify orders / etc []  - 0 Routine Transfer to another Facility (non-emergent condition) []  - 0 Routine Hospital Admission (non-emergent condition) []  - 0 New Admissions / Biomedical engineer / Ordering NPWT Apligraf, etc. , []  - 0 Emergency Hospital Admission (emergent condition) X- 1 10 Simple Discharge Coordination []  - 0 Complex (extensive) Discharge Coordination PROCESS - Special Needs []  - 0 Pediatric / Minor Patient Management []  - 0 Isolation Patient Management []  - 0 Hearing / Language / Visual special needs []  - 0 Assessment of Community  assistance (transportation, D/C planning, etc.) []  - 0 Additional assistance / Altered mentation []  - 0 Support Surface(s) Assessment (bed, cushion, seat, etc.) INTERVENTIONS - Wound Cleansing / Measurement X -  Simple Wound Cleansing - one wound 1 5 []  - 0 Complex Wound Cleansing - multiple wounds X- 1 5 Wound Imaging (photographs - any number of wounds) []  - 0 Wound Tracing (instead of photographs) X- 1 5 Simple Wound Measurement - one wound []  - 0 Complex Wound Measurement - multiple wounds INTERVENTIONS - Wound Dressings X - Small Wound Dressing one or multiple wounds 1 10 []  - 0 Medium Wound Dressing one or multiple wounds []  - 0 Large Wound Dressing one or multiple wounds []  - 0 Application of Medications - topical []  - 0 Application of Medications - injection INTERVENTIONS - Miscellaneous []  - 0 External ear exam []  - 0 Specimen Collection (cultures, biopsies, blood, body fluids, etc.) []  - 0 Specimen(s) / Culture(s) sent or taken to Lab for analysis []  - 0 Patient Transfer (multiple staff / Civil Service fast streamer / Similar devices) []  - 0 Simple Staple / Suture removal (25 or less) []  - 0 Complex Staple / Suture removal (26 or more) []  - 0 Hypo / Hyperglycemic Management (close monitor of Blood Glucose) []  - 0 Ankle / Brachial Index (ABI) - do not check if billed separately X- 1 5 Vital Signs Has the patient been seen at the hospital within the last three years: Yes Total Score: 90 Level Of Care: New/Established - Level 3 Electronic Signature(s) Signed: 09/08/2022 4:24:30 PM By: [name] Entered By: [name] on Brown 11:20:33 [name], [name] ([id number]) [id number] Page 3 of 7 -------------------------------------------------------------------------------- Encounter Discharge Information Details Patient Name: Date of Service: Renee Brown, Renee Brown Renee Brown. 09/02/2022 11:00 [name] Medical Record Number: [id number] Patient  Account Number: [id number] Date of Birth/Sex: Treating RN: 1996/03/11 (27 y.o. [name], Renee Brown Primary Care Qamar Rosman: Renee Brown Renee Brown Other Clinician: Referring Biannca Scantlin: Treating Patric Vanpelt/Extender: Renee Brown, Renee Brown Renee Brown Weeks in Treatment: 5 Encounter Discharge Information Items Discharge Condition: Stable Ambulatory Status: Ambulatory Discharge Destination: Home Transportation: Private Auto Accompanied By: self Schedule Follow-up Appointment: Yes Clinical Summary of Care: Patient Declined Electronic Signature(s) Signed: 09/08/2022 4:24:30 PM By: [name] Entered By: [name] on Brown 11:21:01 -------------------------------------------------------------------------------- Lower Extremity Assessment Details Patient Name: Date of Service: Renee Brown Renee Brown. 09/02/2022 11:00 [name] Medical Record Number: [id number] Patient Account Number: [id number] Date of Birth/Sex: Treating RN: 01-05-1996 (27 y.o. F) Primary Care Aarvi Stotts: Renee Brown Renee Brown Other Clinician: Referring Misti Towle: Treating Maeson Purohit/Extender: Renee Brown, Renee Brown Renee Brown Weeks in Treatment: 5 Edema Assessment Assessed: [Left: No] [Right: No] Edema: [Left: N] [Right: o] Calf Left: Right: Point of Measurement: From Medial Instep 38 cm Ankle Left: Right: Point of Measurement: From Medial Instep 23.4 cm Electronic Signature(s) Signed: 09/02/2022 4:59:34 PM By: Renee Brown Entered By: Renee Brown 10:59:42 -------------------------------------------------------------------------------- Multi Wound Chart Details Patient Name: Date of Service: Renee Bump RBA Renee Brown. 09/02/2022 11:00 [name] Medical Record Number: [id number] Patient Account Number: [id number] Date of Birth/Sex: Treating RN: November 15, 1995 (27 y.o. F) Primary Care Sandro Burgo: Renee Brown Renee Brown Other Clinician: Referring Faisal Stradling: Treating Mckayla Mulcahey/Extender: Renee Brown, Renee Brown Renee Brown Weeks in Treatment:  5 Vital Signs Height(in): 64 Pulse(bpm): 96 Weight(lbs): 170 Blood Pressure(mmHg): 135/86 Body Mass Index(BMI): 29.2 Temperature(F): 98.5 Renee Brown, Renee Brown ([id number]) [id number] Page 4 of 7 Respiratory Rate(breaths/min): 18 [1:Photos:] [N/Brown:N/Brown] Left, Dorsal Foot N/Brown N/Brown Wound Location: Laceration N/Brown N/Brown Wounding Event: Trauma, Other N/Brown N/Brown Primary Etiology: 07/09/2022 N/Brown N/Brown Date Acquired: 5 N/Brown N/Brown Weeks of Treatment: Healed - Epithelialized N/Brown N/Brown Wound Status: No N/Brown N/Brown Wound Recurrence: 0x0x0 N/Brown  N/Brown Measurements L x W x D (cm) 0 N/Brown N/Brown Brown (cm) : rea 0 N/Brown N/Brown Volume (cm) : 100.00% N/Brown N/Brown % Reduction in Brown rea: 100.00% N/Brown N/Brown % Reduction in Volume: Full Thickness Without Exposed N/Brown N/Brown Classification: Support Structures Medium N/Brown N/Brown Exudate Brown mount: Serosanguineous N/Brown N/Brown Exudate Type: red, Brown N/Brown N/Brown Exudate Color: Distinct, outline attached N/Brown N/Brown Wound Margin: None Present (0%) N/Brown N/Brown Granulation Amount: Large (67-100%) N/Brown N/Brown Necrotic Amount: Eschar N/Brown N/Brown Necrotic Tissue: Fat Layer (Subcutaneous Tissue): Yes N/Brown N/Brown Exposed Structures: Fascia: No Tendon: No Muscle: No Joint: No Bone: No Small (1-33%) N/Brown N/Brown Epithelialization: No Abnormalities Noted N/Brown N/Brown Periwound Skin Texture: Maceration: No N/Brown N/Brown Periwound Skin Moisture: Dry/Scaly: No No Abnormalities Noted N/Brown N/Brown Periwound Skin Color: No Abnormality N/Brown N/Brown Temperature: Yes N/Brown N/Brown Tenderness on Palpation: Treatment Notes Electronic Signature(s) Signed: 09/02/2022 11:59:17 AM By: Kalman Shan DO Entered By: Kalman Shan on Brown 11:09:08 -------------------------------------------------------------------------------- Multi-Disciplinary Care Plan Details Patient Name: Date of Service: Renee Bump RBA Renee Brown. 09/02/2022 11:00 [name] Medical Record Number: [id number] Patient Account Number: [id number] Date of  Birth/Sex: Treating RN: 10/10/1995 (27 y.o. [name], Renee Brown Primary Care Glendale Wherry: Renee Brown Renee Brown Other Clinician: Referring Marolyn Urschel: Treating Sarajane Fambrough/Extender: Doreene Burke Renee Brown Weeks in Treatment: Fabens reviewed with physician Active Inactive Electronic Signature(s) Signed: 09/08/2022 4:24:30 PM By: [name] Entered By: [name] on Brown 11:19:34 Smitherman, [name] ([id number]) [id number] Page 5 of 7 -------------------------------------------------------------------------------- Pain Assessment Details Patient Name: Date of Service: Renee Brown, Renee Brown Renee Brown. 09/02/2022 11:00 [name] Medical Record Number: [id number] Patient Account Number: [id number] Date of Birth/Sex: Treating RN: 1995-09-04 (26 y.o. F) Primary Care Myliyah Rebuck: Renee Brown Renee Brown Other Clinician: Referring Lyrick Worland: Treating Tyniya Kuyper/Extender: Renee Brown, Renee Brown Renee Brown Weeks in Treatment: 5 Active Problems Location of Pain Severity and Description of Pain Patient Has Paino No Site Locations Pain Management and Medication Current Pain Management: Electronic Signature(s) Signed: 09/02/2022 4:59:34 PM By: Renee Brown Entered By: Renee Brown 10:56:30 -------------------------------------------------------------------------------- Patient/Caregiver Education Details Patient Name: Date of Service: Renee Brown Renee Brown. 3/28/2024andnbsp11:00 [name] Medical Record Number: [id number] Patient Account Number: [id number] Date of Birth/Gender: Treating RN: 04/17/1996 (27 y.o. Benjaman Lobe Primary Care Physician: Renee Brown Renee Brown Other Clinician: Referring Physician: Treating Physician/Extender: Renee Brown, Renee Brown Renee Brown Weeks in Treatment: 5 Education Assessment Education Provided To: Patient Education Topics Provided Wound/Skin Impairment: Methods: Explain/Verbal Responses: Reinforcements needed,  State content correctly Electronic Signature(s) Signed: 09/08/2022 4:24:30 PM By: [name] Entered By: [name] on Brown 11:19:48 Pennebaker, [name] ([id number]) [id number] Page 6 of 7 -------------------------------------------------------------------------------- Wound Assessment Details Patient Name: Date of Service: Renee Brown, Renee Brown Renee Brown. 09/02/2022 11:00 [name] Medical Record Number: [id number] Patient Account Number: [id number] Date of Birth/Sex: Treating RN: 1995-08-10 (27 y.o. [name], Renee Brown Primary Care Svetlana Bagby: Renee Brown Renee Brown Other Clinician: Referring Hendy Brindle: Treating Briceyda Abdullah/Extender: Renee Brown, Renee Brown Renee Brown Weeks in Treatment: 5 Wound Status Wound Number: 1 Primary Etiology: Trauma, Other Wound Location: Left, Dorsal Foot Wound Status: Healed - Epithelialized Wounding Event: Laceration Date Acquired: 07/09/2022 Weeks Of Treatment: 5 Clustered Wound: No Photos Wound Measurements Length: (cm) Width: (cm) Depth: (cm) Area: (cm) Volume: (cm) 0 % Reduction in Area: 100% 0 % Reduction in Volume: 100% 0 Epithelialization: Small (1-33%) 0 Tunneling: No 0 Undermining: No Wound Description Classification: Full Thickness Without Exposed Support Structures Wound Margin: Distinct, outline attached Exudate Amount: Medium  Exudate Type: Serosanguineous Exudate Color: red, Brown Foul Odor After Cleansing: No Slough/Fibrino Yes Wound Bed Granulation Amount: None Present (0%) Exposed Structure Necrotic Amount: Large (67-100%) Fascia Exposed: No Necrotic Quality: Eschar Fat Layer (Subcutaneous Tissue) Exposed: Yes Tendon Exposed: No Muscle Exposed: No Joint Exposed: No Bone Exposed: No Periwound Skin Texture Texture Color No Abnormalities Noted: Yes No Abnormalities Noted: Yes Moisture Temperature / Pain No Abnormalities Noted: No Temperature: No Abnormality Dry / Scaly: No Tenderness on Palpation:  Yes Maceration: No Treatment Notes Wound #1 (Foot) Wound Laterality: Dorsal, Left Cleanser Peri-Wound Care Topical Primary Dressing Renee Brown, Renee Brown ([id number]) [id number] Page 7 of 7 Secondary Dressing Secured With Compression Wrap Compression Stockings Add-Ons Electronic Signature(s) Signed: 09/08/2022 4:24:30 PM By: [name] Entered By: [name] on Brown 11:06:23 -------------------------------------------------------------------------------- Vitals Details Patient Name: Date of Service: Renee Bump RBA Renee Brown. 09/02/2022 11:00 [name] Medical Record Number: [id number] Patient Account Number: [id number] Date of Birth/Sex: Treating RN: October 03, 1995 (27 y.o. F) Primary Care Daesha Insco: Renee Brown Renee Brown Other Clinician: Referring Kenia Teagarden: Treating Valincia Touch/Extender: Renee Brown, Renee Brown Renee Brown Weeks in Treatment: 5 Vital Signs Time Taken: 10:55 Temperature (F): 98.5 Height (in): 64 Pulse (bpm): 96 Weight (lbs): 170 Respiratory Rate (breaths/min): 18 Body Mass Index (BMI): 29.2 Blood Pressure (mmHg): 135/86 Reference Range: 80 - 120 mg / dl Electronic Signature(s) Signed: 09/02/2022 4:59:34 PM By: Renee Brown Entered By: Renee Brown 10:56:19

## 2022-09-28 IMAGING — DX DG CHEST 2V
2 series · 2 of 2 positions shown · non-contrast
Comparison: None.

CLINICAL DATA: Pain under right breast.

EXAM:
CHEST - 2 VIEW

[chest pa]
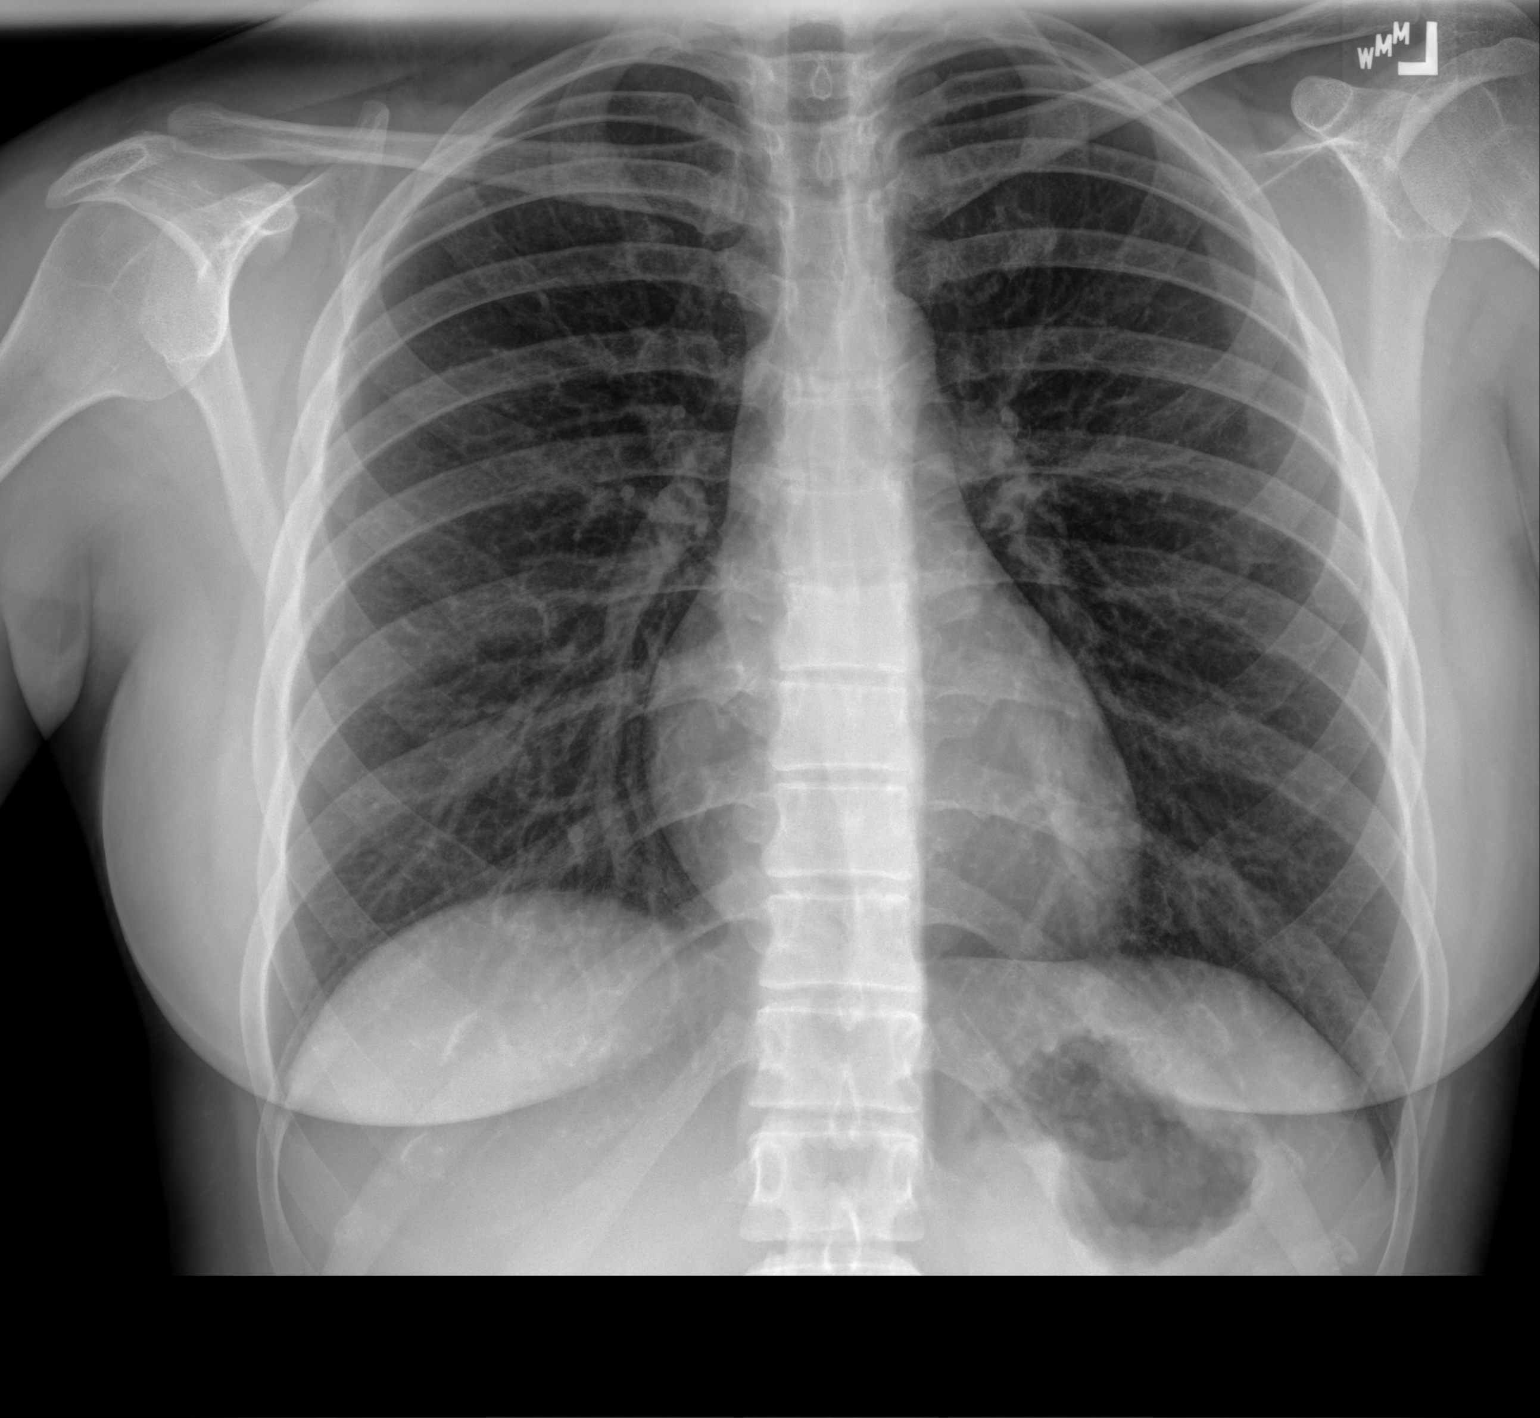

[chest lat]
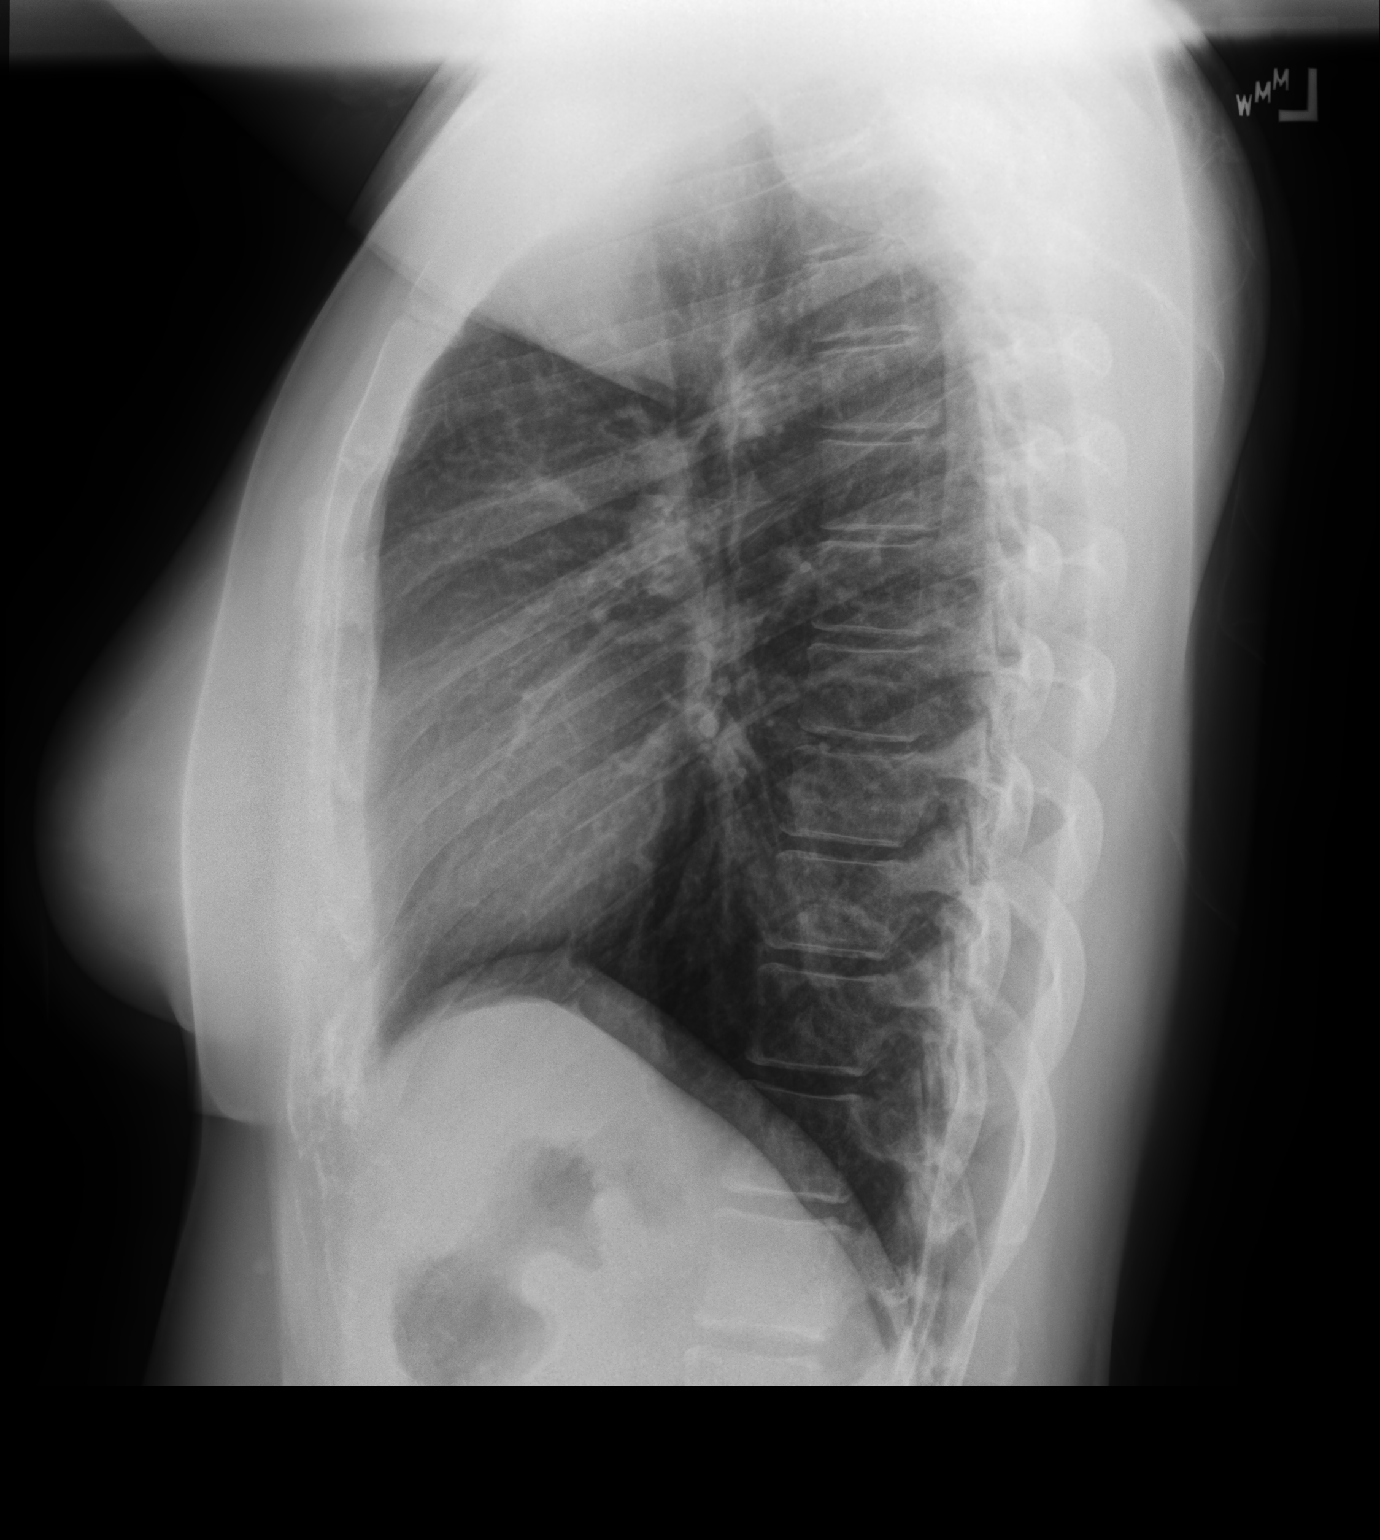

[2 of 2 positions shown; findings below may reference images not displayed]

FINDINGS: The heart size and mediastinal contours are within normal limits.
Both lungs are clear. No visible pleural effusions or pneumothorax.
No acute osseous abnormality.
IMPRESSION: No active cardiopulmonary disease.

## 2024-01-20 DIAGNOSIS — Z7689 Persons encountering health services in other specified circumstances: Secondary | ICD-10-CM | POA: Diagnosis not present

## 2024-01-20 DIAGNOSIS — Z833 Family history of diabetes mellitus: Secondary | ICD-10-CM | POA: Diagnosis not present

## 2024-01-20 DIAGNOSIS — F419 Anxiety disorder, unspecified: Secondary | ICD-10-CM | POA: Diagnosis not present

## 2024-01-20 DIAGNOSIS — Z8249 Family history of ischemic heart disease and other diseases of the circulatory system: Secondary | ICD-10-CM | POA: Diagnosis not present

## 2024-01-20 DIAGNOSIS — F32A Depression, unspecified: Secondary | ICD-10-CM | POA: Diagnosis not present
